# Patient Record
Sex: Male | Born: 1965 | Race: White | Hispanic: No | Smoking: Never smoker
Health system: Southern US, Community
[De-identification: ages and names within clinical notes are randomized; demographics above are authoritative.]

## PROBLEM LIST (undated history)

## (undated) DIAGNOSIS — I10 Essential (primary) hypertension: Secondary | ICD-10-CM

## (undated) DIAGNOSIS — R945 Abnormal results of liver function studies: Secondary | ICD-10-CM

## (undated) DIAGNOSIS — N5089 Other specified disorders of the male genital organs: Secondary | ICD-10-CM

## (undated) DIAGNOSIS — R7989 Other specified abnormal findings of blood chemistry: Secondary | ICD-10-CM

## (undated) DIAGNOSIS — R7303 Prediabetes: Secondary | ICD-10-CM

## (undated) DIAGNOSIS — E119 Type 2 diabetes mellitus without complications: Secondary | ICD-10-CM

## (undated) DIAGNOSIS — C801 Malignant (primary) neoplasm, unspecified: Secondary | ICD-10-CM

## (undated) DIAGNOSIS — Z973 Presence of spectacles and contact lenses: Secondary | ICD-10-CM

## (undated) DIAGNOSIS — E785 Hyperlipidemia, unspecified: Secondary | ICD-10-CM

## (undated) DIAGNOSIS — J302 Other seasonal allergic rhinitis: Secondary | ICD-10-CM

## (undated) HISTORY — DX: Abnormal results of liver function studies: R94.5

## (undated) HISTORY — DX: Malignant (primary) neoplasm, unspecified: C80.1

## (undated) HISTORY — DX: Hyperlipidemia, unspecified: E78.5

## (undated) HISTORY — DX: Other specified abnormal findings of blood chemistry: R79.89

## (undated) HISTORY — DX: Essential (primary) hypertension: I10

## (undated) HISTORY — DX: Prediabetes: R73.03

## (undated) HISTORY — DX: Other seasonal allergic rhinitis: J30.2

## (undated) HISTORY — PX: REFRACTIVE SURGERY: SHX103

---
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
## 1997-08-07 HISTORY — PX: VASECTOMY: SHX75

## 2009-08-07 HISTORY — PX: RETINAL DETACHMENT SURGERY: SHX105

## 2009-08-07 HISTORY — PX: KNEE ARTHROSCOPY: SHX127

## 2010-06-07 HISTORY — PX: KNEE ARTHROSCOPY: SHX127

## 2013-02-28 ENCOUNTER — Other Ambulatory Visit: Payer: Self-pay | Admitting: Family Medicine

## 2013-02-28 MED ORDER — IRBESARTAN 300 MG PO TABS: 300.0000 mg | ORAL_TABLET | Freq: Every day | ORAL | Status: DC

## 2013-02-28 NOTE — Telephone Encounter (Signed)
Rx Refilled  

## 2013-06-30 ENCOUNTER — Other Ambulatory Visit: Payer: Self-pay | Admitting: Family Medicine

## 2013-07-01 ENCOUNTER — Encounter: Payer: Self-pay | Admitting: Family Medicine

## 2013-07-01 NOTE — Telephone Encounter (Signed)
Medication refill for one time only.  Patient needs to be seen.  Letter sent for patient to call and schedule 

## 2013-07-02 ENCOUNTER — Telehealth: Payer: Self-pay | Admitting: Family Medicine

## 2013-07-02 NOTE — Telephone Encounter (Signed)
Pt is calling today because his refill on his Allopurinol was denied and he was told to contact the office Pharmacy is [facility] on [address] Call back number is [phone] If he doesn't answer he said just to leave him a voicemail

## 2013-07-02 NOTE — Telephone Encounter (Signed)
.  Patient aware per vm - ntbs - lov 07/08/2012 and rx was filled for 30 day supply - long enough for him to make appt.

## 2013-07-17 ENCOUNTER — Encounter: Payer: Self-pay | Admitting: Family Medicine

## 2013-07-17 ENCOUNTER — Ambulatory Visit (INDEPENDENT_AMBULATORY_CARE_PROVIDER_SITE_OTHER): Payer: No Typology Code available for payment source | Admitting: Family Medicine

## 2013-07-17 VITALS — BP 140/82 | HR 72 | Temp 98.6°F | Resp 18 | Ht 69.0 in | Wt 232.0 lb

## 2013-07-17 DIAGNOSIS — R7303 Prediabetes: Secondary | ICD-10-CM | POA: Insufficient documentation

## 2013-07-17 DIAGNOSIS — I10 Essential (primary) hypertension: Secondary | ICD-10-CM

## 2013-07-17 NOTE — Progress Notes (Signed)
   Subjective:    Patient ID: Leonard Garrett, male    DOB: 26-Nov-1965, 47 y.o.   MRN: [id number]  HPI  Patient is here today for followup of his hypertension, hyperlipidemia, and prediabetes. Patient monitors his blood pressure home and states it runs 120s to 130s over 70s to 80s. He denies any chest pain shortness of breath or dyspnea on exertion. He does have a history of prediabetes. Is overdue to check his blood sugar as well as hemoglobin A1c. He also has a history of dyslipidemia but he refuses medications to lower his cholesterol. He also declines a flu shot today. Past Medical History  Diagnosis Date  . Hypertension   . Hyperlipidemia   . Prediabetes    Current Outpatient Prescriptions on File Prior to Visit  Medication Sig Dispense Refill  . allopurinol (ZYLOPRIM) 300 MG tablet TAKE 1 TABLET BY MOUTH EVERY DAY FOR GOUT  30 tablet  0  . irbesartan (AVAPRO) 300 MG tablet Take 1 tablet (300 mg total) by mouth at bedtime.  30 tablet  5   No current facility-administered medications on file prior to visit.   No Known Allergies History   Social History  . Marital Status: Widowed    Spouse Name: N/A    Number of Children: N/A  . Years of Education: N/A   Occupational History  . Not on file.   Social History Main Topics  . Smoking status: Never Smoker   . Smokeless tobacco: Not on file  . Alcohol Use: Yes  . Drug Use: Not on file  . Sexual Activity: Not on file   Other Topics Concern  . Not on file   Social History Narrative  . No narrative on file     Review of Systems  All other systems reviewed and are negative.       Objective:   Physical Exam  Vitals reviewed. Neck: No JVD present. No thyromegaly present.  Cardiovascular: Normal rate, regular rhythm and normal heart sounds.  Exam reveals no gallop and no friction rub.   No murmur heard. Pulmonary/Chest: Effort normal and breath sounds normal. No respiratory distress. He has no wheezes. He has no rales.    Abdominal: Soft. Bowel sounds are normal. He exhibits no distension. There is no tenderness. There is no rebound and no guarding.  Musculoskeletal: He exhibits no edema.          Assessment & Plan:  1. HTN (hypertension) Blood pressures not controlled today. Patient is adamant that his blood pressure is better at home. He states he will monitor at home for the next 2 weeks and notify me of the results. I explained to him the increased risk of cardiovascular disease blood pressures are elevated. Patient states he will monitor. His history prediabetes. I asked him to return fasting for hemoglobin A1c is monitor this along with fasting lipid panel. However the patient is very resistant chime medications for cholesterol. He also declines a flu shot today adamantly - COMPLETE METABOLIC PANEL WITH GFR; Future - Lipid panel; Future - Hemoglobin A1c; Future

## 2013-07-18 ENCOUNTER — Other Ambulatory Visit: Payer: No Typology Code available for payment source

## 2013-07-18 DIAGNOSIS — I10 Essential (primary) hypertension: Secondary | ICD-10-CM

## 2013-07-18 LAB — COMPLETE METABOLIC PANEL WITH GFR
ALT: 30 U/L (ref 0–53)
AST: 28 U/L (ref 0–37)
CO2: 29 mEq/L (ref 19–32)
Calcium: 9.7 mg/dL (ref 8.4–10.5)
Chloride: 100 mEq/L (ref 96–112)
GFR, Est African American: >89 mL/min
Sodium: 138 mEq/L (ref 135–145)
Total Bilirubin: 0.9 mg/dL (ref 0.3–1.2)
Total Protein: 7.4 g/dL (ref 6.0–8.3)

## 2013-07-18 LAB — HEMOGLOBIN A1C
Hgb A1c MFr Bld: 5.2 % (ref <5.7)
Mean Plasma Glucose: 103 mg/dL (ref <117)

## 2013-07-18 LAB — LIPID PANEL
HDL: 62 mg/dL (ref >39)
LDL Cholesterol: 63 mg/dL (ref 0–99)
Triglycerides: 145 mg/dL (ref <150)
VLDL: 29 mg/dL (ref 0–40)

## 2013-08-08 ENCOUNTER — Other Ambulatory Visit: Payer: Self-pay | Admitting: Family Medicine

## 2013-08-17 ENCOUNTER — Encounter: Payer: Self-pay | Admitting: Family Medicine

## 2013-08-31 ENCOUNTER — Other Ambulatory Visit: Payer: Self-pay | Admitting: Family Medicine

## 2013-09-01 ENCOUNTER — Other Ambulatory Visit: Payer: Self-pay | Admitting: Family Medicine

## 2014-01-05 ENCOUNTER — Other Ambulatory Visit: Payer: Self-pay | Admitting: Family Medicine

## 2014-02-08 ENCOUNTER — Other Ambulatory Visit: Payer: Self-pay | Admitting: Family Medicine

## 2014-03-03 ENCOUNTER — Other Ambulatory Visit: Payer: Self-pay | Admitting: Family Medicine

## 2014-03-03 NOTE — Telephone Encounter (Signed)
Refill appropriate and filled per protocol. 

## 2014-06-17 ENCOUNTER — Other Ambulatory Visit: Payer: Self-pay | Admitting: Family Medicine

## 2014-06-18 NOTE — Telephone Encounter (Signed)
Medication filled x1 with no refills.   Requires office visit before any further refills can be given.   Letter sent.  

## 2014-07-14 ENCOUNTER — Other Ambulatory Visit: Payer: Self-pay | Admitting: Family Medicine

## 2014-07-15 NOTE — Telephone Encounter (Signed)
Medication filled x1 with no refills.   CPE scheduled for 07/21/2014.

## 2014-07-16 ENCOUNTER — Other Ambulatory Visit: Payer: Self-pay | Admitting: Family Medicine

## 2014-07-16 ENCOUNTER — Other Ambulatory Visit: Payer: No Typology Code available for payment source

## 2014-07-16 DIAGNOSIS — Z0000 Encounter for general adult medical examination without abnormal findings: Secondary | ICD-10-CM

## 2014-07-16 DIAGNOSIS — Z79899 Other long term (current) drug therapy: Secondary | ICD-10-CM

## 2014-07-16 LAB — COMPLETE METABOLIC PANEL WITH GFR
ALBUMIN: 4.3 g/dL (ref 3.5–5.2)
ALK PHOS: 63 U/L (ref 39–117)
ALT: 56 U/L — ABNORMAL HIGH (ref 0–53)
AST: 49 U/L — AB (ref 0–37)
BUN: 11 mg/dL (ref 6–23)
CO2: 27 mEq/L (ref 19–32)
Calcium: 9.9 mg/dL (ref 8.4–10.5)
Chloride: 101 mEq/L (ref 96–112)
Creat: 0.98 mg/dL (ref 0.50–1.35)
GFR, Est African American: >89 mL/min
Glucose, Bld: 117 mg/dL — ABNORMAL HIGH (ref 70–99)
POTASSIUM: 4.3 meq/L (ref 3.5–5.3)
Sodium: 138 mEq/L (ref 135–145)
Total Bilirubin: 0.7 mg/dL (ref 0.2–1.2)
Total Protein: 7 g/dL (ref 6.0–8.3)

## 2014-07-16 LAB — LIPID PANEL
CHOLESTEROL: 158 mg/dL (ref 0–200)
HDL: 53 mg/dL (ref >39)
LDL Cholesterol: 79 mg/dL (ref 0–99)
Total CHOL/HDL Ratio: 3 Ratio
Triglycerides: 132 mg/dL (ref <150)
VLDL: 26 mg/dL (ref 0–40)

## 2014-07-16 LAB — CBC WITH DIFFERENTIAL/PLATELET
Basophils Absolute: 0.1 10*3/uL (ref 0.0–0.1)
Basophils Relative: 1 % (ref 0–1)
EOS PCT: 1 % (ref 0–5)
Eosinophils Absolute: 0.1 10*3/uL (ref 0.0–0.7)
HEMATOCRIT: 37.1 % — AB (ref 39.0–52.0)
HEMOGLOBIN: 12.8 g/dL — AB (ref 13.0–17.0)
LYMPHS PCT: 23 % (ref 12–46)
Lymphs Abs: 1.9 10*3/uL (ref 0.7–4.0)
MCH: 31.8 pg (ref 26.0–34.0)
MCHC: 34.5 g/dL (ref 30.0–36.0)
MCV: 92.1 fL (ref 78.0–100.0)
MONOS PCT: 9 % (ref 3–12)
MPV: 9.2 fL — AB (ref 9.4–12.4)
Monocytes Absolute: 0.7 10*3/uL (ref 0.1–1.0)
Neutro Abs: 5.4 10*3/uL (ref 1.7–7.7)
Neutrophils Relative %: 66 % (ref 43–77)
Platelets: 251 10*3/uL (ref 150–400)
RBC: 4.03 MIL/uL — ABNORMAL LOW (ref 4.22–5.81)
RDW: 13.3 % (ref 11.5–15.5)
WBC: 8.2 10*3/uL (ref 4.0–10.5)

## 2014-07-16 LAB — HEMOGLOBIN A1C
Hgb A1c MFr Bld: 6.2 % — ABNORMAL HIGH (ref <5.7)
Mean Plasma Glucose: 131 mg/dL — ABNORMAL HIGH (ref <117)

## 2014-07-21 ENCOUNTER — Encounter: Payer: Self-pay | Admitting: Family Medicine

## 2014-07-21 ENCOUNTER — Ambulatory Visit (INDEPENDENT_AMBULATORY_CARE_PROVIDER_SITE_OTHER): Payer: No Typology Code available for payment source | Admitting: Family Medicine

## 2014-07-21 VITALS — BP 148/84 | HR 76 | Temp 98.0°F | Resp 18 | Ht 69.5 in | Wt 239.0 lb

## 2014-07-21 DIAGNOSIS — Z0000 Encounter for general adult medical examination without abnormal findings: Secondary | ICD-10-CM

## 2014-07-21 NOTE — Progress Notes (Signed)
Subjective:    Patient ID: Leonard Garrett, male    DOB: May 05, 1966, 48 y.o.   MRN: [id number]  HPI Patient is here for CPE.    He has no major medical concerns. He is due for colonoscopy. He is due for a PSA and digital rectal exam. Patient refuses his flu shot today. He declines a tetanus vaccine.Most recent lab work is listed below: Lab on 07/16/2014  Component Date Value Ref Range Status  . Cholesterol 07/16/2014 158  0 - 200 mg/dL Final   Comment: ATP III Classification:       < 200        mg/dL        Desirable      200 - 239     mg/dL        Borderline High      >= 240        mg/dL        High     . Triglycerides 07/16/2014 132  <150 mg/dL Final  . HDL 07/16/2014 53  >39 mg/dL Final  . Total CHOL/HDL Ratio 07/16/2014 3.0   Final  . VLDL 07/16/2014 26  0 - 40 mg/dL Final  . LDL Cholesterol 07/16/2014 79  0 - 99 mg/dL Final   Comment:   Total Cholesterol/HDL Ratio:CHD Risk                        Coronary Heart Disease Risk Table                                        Men       Women          1/2 Average Risk              3.4        3.3              Average Risk              5.0        4.4           2X Average Risk              9.6        7.1           3X Average Risk             23.4       11.0 Use the calculated Patient Ratio above and the CHD Risk table  to determine the patient's CHD Risk. ATP III Classification (LDL):       < 100        mg/dL         Optimal      100 - 129     mg/dL         Near or Above Optimal      130 - 159     mg/dL         Borderline High      160 - 189     mg/dL         High       > 190        mg/dL         Very High     . WBC 07/16/2014 8.2  4.0 - 10.5 K/uL Final  .  RBC 07/16/2014 4.03* 4.22 - 5.81 MIL/uL Final  . Hemoglobin 07/16/2014 12.8* 13.0 - 17.0 g/dL Final  . HCT 07/16/2014 37.1* 39.0 - 52.0 % Final  . MCV 07/16/2014 92.1  78.0 - 100.0 fL Final  . MCH 07/16/2014 31.8  26.0 - 34.0 pg Final  . MCHC 07/16/2014 34.5  30.0 - 36.0 g/dL  Final  . RDW 07/16/2014 13.3  11.5 - 15.5 % Final  . Platelets 07/16/2014 251  150 - 400 K/uL Final  . MPV 07/16/2014 9.2* 9.4 - 12.4 fL Final  . Neutrophils Relative % 07/16/2014 66  43 - 77 % Final  . Neutro Abs 07/16/2014 5.4  1.7 - 7.7 K/uL Final  . Lymphocytes Relative 07/16/2014 23  12 - 46 % Final  . Lymphs Abs 07/16/2014 1.9  0.7 - 4.0 K/uL Final  . Monocytes Relative 07/16/2014 9  3 - 12 % Final  . Monocytes Absolute 07/16/2014 0.7  0.1 - 1.0 K/uL Final  . Eosinophils Relative 07/16/2014 1  0 - 5 % Final  . Eosinophils Absolute 07/16/2014 0.1  0.0 - 0.7 K/uL Final  . Basophils Relative 07/16/2014 1  0 - 1 % Final  . Basophils Absolute 07/16/2014 0.1  0.0 - 0.1 K/uL Final  . Smear Review 07/16/2014 Criteria for review not met   Final  . Hgb A1c MFr Bld 07/16/2014 6.2* <5.7 % Final   Comment:                                                                        According to the ADA Clinical Practice Recommendations for 2011, when HbA1c is used as a screening test:     >=6.5%   Diagnostic of Diabetes Mellitus            (if abnormal result is confirmed)   5.7-6.4%   Increased risk of developing Diabetes Mellitus   References:Diagnosis and Classification of Diabetes Mellitus,Diabetes LXBW,6203,55(HRCBU 1):S62-S69 and Standards of Medical Care in         Diabetes - 2011,Diabetes LAGT,3646,80 (Suppl 1):S11-S61.     . Mean Plasma Glucose 07/16/2014 131* <117 mg/dL Final  . Sodium 07/16/2014 138  135 - 145 mEq/L Final  . Potassium 07/16/2014 4.3  3.5 - 5.3 mEq/L Final  . Chloride 07/16/2014 101  96 - 112 mEq/L Final  . CO2 07/16/2014 27  19 - 32 mEq/L Final  . Glucose, Bld 07/16/2014 117* 70 - 99 mg/dL Final  . BUN 07/16/2014 11  6 - 23 mg/dL Final  . Creat 07/16/2014 0.98  0.50 - 1.35 mg/dL Final  . Total Bilirubin 07/16/2014 0.7  0.2 - 1.2 mg/dL Final  . Alkaline Phosphatase 07/16/2014 63  39 - 117 U/L Final  . AST 07/16/2014 49* 0 - 37 U/L Final  . ALT 07/16/2014 56* 0 -  53 U/L Final  . Total Protein 07/16/2014 7.0  6.0 - 8.3 g/dL Final  . Albumin 07/16/2014 4.3  3.5 - 5.2 g/dL Final  . Calcium 07/16/2014 9.9  8.4 - 10.5 mg/dL Final  . GFR, Est African American 07/16/2014 >89   Final  . GFR, Est Non African American 07/16/2014 >89   Final   Comment:   The estimated GFR is a  calculation valid for adults (>=2 years old) that uses the CKD-EPI algorithm to adjust for age and sex. It is   not to be used for children, pregnant women, hospitalized patients,    patients on dialysis, or with rapidly changing kidney function. According to the NKDEP, eGFR >89 is normal, 60-89 shows mild impairment, 30-59 shows moderate impairment, 15-29 shows severe impairment and <15 is ESRD.      Past Medical History  Diagnosis Date  . Hypertension   . Hyperlipidemia   . Prediabetes    No past surgical history on file. Current Outpatient Prescriptions on File Prior to Visit  Medication Sig Dispense Refill  . allopurinol (ZYLOPRIM) 300 MG tablet TAKE 1 TABLET BY MOUTH EVERY DAY FOR GOUT 30 tablet 0  . amLODipine (NORVASC) 5 MG tablet TAKE 1 TABLET BY MOUTH EVERY DAY 30 tablet 0  . aspirin 81 MG tablet Take 81 mg by mouth daily.    Marland Kitchen glucosamine-chondroitin 500-400 MG tablet Take 1 tablet by mouth 3 (three) times daily.    . irbesartan (AVAPRO) 300 MG tablet TAKE 1 TABLET BY MOUTH EVERY NIGHT AT BEDTIME 30 tablet 0  . Multiple Vitamins-Minerals (MULTIVITAMIN PO) Take by mouth.    . Omega-3 Fatty Acids (FISH OIL) 1200 MG CPDR Take by mouth.     No current facility-administered medications on file prior to visit.   No Known Allergies History   Social History  . Marital Status: Widowed    Spouse Name: N/A    Number of Children: N/A  . Years of Education: N/A   Occupational History  . Not on file.   Social History Main Topics  . Smoking status: Never Smoker   . Smokeless tobacco: Not on file  . Alcohol Use: Yes  . Drug Use: Not on file  . Sexual Activity: Not  on file   Other Topics Concern  . Not on file   Social History Narrative  . No narrative on file   No family history on file.    Review of Systems  All other systems reviewed and are negative.      Objective:   Physical Exam  Constitutional: He is oriented to person, place, and time. He appears well-developed and well-nourished. No distress.  HENT:  Head: Normocephalic and atraumatic.  Right Ear: External ear normal.  Left Ear: External ear normal.  Nose: Nose normal.  Mouth/Throat: Oropharynx is clear and moist. No oropharyngeal exudate.  Eyes: Conjunctivae and EOM are normal. Pupils are equal, round, and reactive to light. Right eye exhibits no discharge. Left eye exhibits no discharge. No scleral icterus.  Neck: Normal range of motion. Neck supple. No JVD present. No tracheal deviation present. No thyromegaly present.  Cardiovascular: Normal rate, regular rhythm, normal heart sounds and intact distal pulses.  Exam reveals no gallop and no friction rub.   No murmur heard. Pulmonary/Chest: Effort normal and breath sounds normal. No stridor. No respiratory distress. He has no wheezes. He has no rales. He exhibits no tenderness.  Abdominal: Soft. Bowel sounds are normal. He exhibits no distension and no mass. There is no tenderness. There is no rebound and no guarding.  Genitourinary: Rectum normal and prostate normal.  Musculoskeletal: Normal range of motion. He exhibits no edema or tenderness.  Lymphadenopathy:    He has no cervical adenopathy.  Neurological: He is alert and oriented to person, place, and time. He has normal reflexes. He displays normal reflexes. No cranial nerve deficit. He exhibits normal muscle tone.  Coordination normal.  Skin: Skin is warm. No rash noted. He is not diaphoretic. No erythema. No pallor.  Psychiatric: He has a normal mood and affect. His behavior is normal. Judgment and thought content normal.  Vitals reviewed.         Assessment &  Plan:  Routine general medical examination at a health care facility physical exam is normal. Lab work is significant for prediabetes. I recommended a low carbohydrate diet and increasing aerobic exercise to 30 minutes a day 5 days a week. I would like the patient to return in 3 months for a repeat CMP as well as a hemoglobin A1c. If patient's liver function tests are still elevated at that time, I will proceed with a liver ultrasound and also viral hepatitis panel. Patient refuses a flu shot. I will schedule him for a colonoscopy. Patient is comfortable with monitoring his liver function tests for 3 months. Also consider discontinuing allopurinol if liver tests remain elevated.

## 2014-07-22 LAB — PSA: PSA: 0.68 ng/mL (ref <=4.00)

## 2014-07-23 ENCOUNTER — Encounter: Payer: Self-pay | Admitting: Family Medicine

## 2014-08-09 ENCOUNTER — Other Ambulatory Visit: Payer: Self-pay | Admitting: Family Medicine

## 2014-08-31 ENCOUNTER — Encounter: Payer: Self-pay | Admitting: Gastroenterology

## 2014-09-07 ENCOUNTER — Other Ambulatory Visit: Payer: Self-pay | Admitting: Family Medicine

## 2014-09-08 NOTE — Telephone Encounter (Signed)
Medication refilled per protocol. 

## 2014-10-16 ENCOUNTER — Ambulatory Visit (AMBULATORY_SURGERY_CENTER): Payer: Self-pay | Admitting: *Deleted

## 2014-10-16 VITALS — Ht 69.0 in | Wt 243.8 lb

## 2014-10-16 DIAGNOSIS — Z1211 Encounter for screening for malignant neoplasm of colon: Secondary | ICD-10-CM

## 2014-10-16 MED ORDER — MOVIPREP 100 G PO SOLR: 1.0000 | Freq: Once | ORAL | Status: DC

## 2014-10-16 NOTE — Progress Notes (Signed)
Denies allergies to eggs or soy products. Denies complications with sedation or anesthesia. Denies O2 use. Denies use of diet or weight loss medications.  Emmi instructions given for colonoscopy.  

## 2014-10-29 ENCOUNTER — Encounter: Payer: Self-pay | Admitting: Gastroenterology

## 2014-10-29 ENCOUNTER — Ambulatory Visit (AMBULATORY_SURGERY_CENTER): Payer: PRIVATE HEALTH INSURANCE | Admitting: Gastroenterology

## 2014-10-29 VITALS — BP 110/68 | HR 74 | Temp 98.3°F | Resp 32 | Ht 69.0 in | Wt 243.0 lb

## 2014-10-29 DIAGNOSIS — D120 Benign neoplasm of cecum: Secondary | ICD-10-CM

## 2014-10-29 DIAGNOSIS — Z1211 Encounter for screening for malignant neoplasm of colon: Secondary | ICD-10-CM

## 2014-10-29 DIAGNOSIS — D123 Benign neoplasm of transverse colon: Secondary | ICD-10-CM

## 2014-10-29 HISTORY — PX: COLONOSCOPY: SHX174

## 2014-10-29 MED ORDER — SODIUM CHLORIDE 0.9 % IV SOLN: 500.0000 mL | INTRAVENOUS | Status: DC

## 2014-10-29 NOTE — Progress Notes (Signed)
To recobvery, report to [employer], RN, VSS.

## 2014-10-29 NOTE — Progress Notes (Signed)
Called to room to assist during endoscopic procedure.  Patient ID and intended procedure confirmed with present staff. Received instructions for my participation in the procedure from the performing physician.  

## 2014-10-29 NOTE — Patient Instructions (Signed)
Discharge instructions given. Handouts on polyps and diverticulosis. Resume previous medications. YOU HAD AN ENDOSCOPIC PROCEDURE TODAY AT THE [facility]:   Refer to the procedure report that was given to you for any specific questions about what was found during the examination.  If the procedure report does not answer your questions, please call your gastroenterologist to clarify.  If you requested that your care partner not be given the details of your procedure findings, then the procedure report has been included in a sealed envelope for you to review at your convenience later.  YOU SHOULD EXPECT: Some feelings of bloating in the abdomen. Passage of more gas than usual.  Walking can help get rid of the air that was put into your GI tract during the procedure and reduce the bloating. If you had a lower endoscopy (such as a colonoscopy or flexible sigmoidoscopy) you may notice spotting of blood in your stool or on the toilet paper. If you underwent a bowel prep for your procedure, you may not have a normal bowel movement for a few days.  Please Note:  You might notice some irritation and congestion in your nose or some drainage.  This is from the oxygen used during your procedure.  There is no need for concern and it should clear up in a day or so.  SYMPTOMS TO REPORT IMMEDIATELY:   Following lower endoscopy (colonoscopy or flexible sigmoidoscopy):  Excessive amounts of blood in the stool  Significant tenderness or worsening of abdominal pains  Swelling of the abdomen that is new, acute  Fever of 100F or higher   For urgent or emergent issues, a gastroenterologist can be reached at any hour by calling [phone].   DIET: Your first meal following the procedure should be a small meal and then it is ok to progress to your normal diet. Heavy or fried foods are harder to digest and may make you feel nauseous or bloated.  Likewise, meals heavy in dairy and vegetables can  increase bloating.  Drink plenty of fluids but you should avoid alcoholic beverages for 24 hours.  ACTIVITY:  You should plan to take it easy for the rest of today and you should NOT DRIVE or use heavy machinery until tomorrow (because of the sedation medicines used during the test).    FOLLOW UP: Our staff will call the number listed on your records the next business day following your procedure to check on you and address any questions or concerns that you may have regarding the information given to you following your procedure. If we do not reach you, we will leave a message.  However, if you are feeling well and you are not experiencing any problems, there is no need to return our call.  We will assume that you have returned to your regular daily activities without incident.  If any biopsies were taken you will be contacted by phone or by letter within the next 1-3 weeks.  Please call us at [phone] if you have not heard about the biopsies in 3 weeks.    SIGNATURES/CONFIDENTIALITY: You and/or your care partner have signed paperwork which will be entered into your electronic medical record.  These signatures attest to the fact that that the information above on your After Visit Summary has been reviewed and is understood.  Full responsibility of the confidentiality of this discharge information lies with you and/or your care-partner. 

## 2014-10-29 NOTE — Op Note (Signed)
[place]  [address] [place], [address]   COLONOSCOPY PROCEDURE REPORT  PATIENT: Leonard Garrett, Leonard Garrett  MR#: [id number] BIRTHDATE: 21-Apr-1966 , 48  yrs. old GENDER: male ENDOSCOPIST: Ladene Artist, MD, [facility] REFERRED VU:DTHYHO [name], M.D. PROCEDURE DATE:  10/29/2014 PROCEDURE:   Colonoscopy, screening and Colonoscopy with snare polypectomy First Screening Colonoscopy - Avg.  risk and is 50 yrs.  old or older Yes.  Prior Negative Screening - Now for repeat screening. N/A  History of Adenoma - Now for follow-up colonoscopy & has been > or = to 3 yrs.  N/A ASA CLASS:   Class II INDICATIONS:Screening for colonic neoplasia and Colorectal Neoplasm Risk Assessment for this procedure is average risk. MEDICATIONS: Monitored anesthesia care and Propofol 500 mg IV DESCRIPTION OF PROCEDURE:   After the risks benefits and alternatives of the procedure were thoroughly explained, informed consent was obtained.  The digital rectal exam revealed no abnormalities of the rectum.   The LB [id number]  endoscope was introduced through the anus and advanced to the cecum, which was identified by both the appendix and ileocecal valve. No adverse events experienced.   The quality of the prep was adequate (MoviPrep was used)  The instrument was then slowly withdrawn as the colon was fully examined.  COLON FINDINGS: A sessile polyp measuring 10 mm in size was found at the cecum.  A polypectomy was performed using snare cautery.  The resection was complete, the polyp tissue was completely retrieved and sent to histology.   Three sessile polyps measuring 7-8 mm in size were found in the transverse colon.  Polypectomies were performed with a cold snare.  The resection was complete, the polyp tissue was completely retrieved and sent to histology.  There was moderate diverticulosis noted in the sigmoid colon.  The examination was otherwise normal.  Retroflexed views revealed  no abnormalities. The time to cecum = 1.7 Withdrawal time = 16.6   The scope was withdrawn and the procedure completed. COMPLICATIONS: There were no immediate complications.  ENDOSCOPIC IMPRESSION: 1.   Sessile polyp at the cecum; polypectomy performed using snare cautery 2.   Three sessile polyps in the transverse colon; polypectomies performed with a cold snare 3.   Moderate diverticulosis in the sigmoid colon  RECOMMENDATIONS: 1.  Await pathology results 2.  High fiber diet with liberal fluid intake. 3.  Repeat colonoscopy in 3 years if the largest polyp or the 3 others are adenomatous; 5 years if 1-2 smaller polyps are adenomatous; otherwise 10 years  eSigned:  Ladene Artist, MD, [facility] 10/29/2014 11:28 AM

## 2014-11-02 ENCOUNTER — Telehealth: Payer: Self-pay

## 2014-11-02 DIAGNOSIS — H2513 Age-related nuclear cataract, bilateral: Secondary | ICD-10-CM | POA: Insufficient documentation

## 2014-11-02 DIAGNOSIS — H401290 Low-tension glaucoma, unspecified eye, stage unspecified: Secondary | ICD-10-CM | POA: Insufficient documentation

## 2014-11-02 DIAGNOSIS — H31099 Other chorioretinal scars, unspecified eye: Secondary | ICD-10-CM | POA: Insufficient documentation

## 2014-11-02 DIAGNOSIS — H35439 Paving stone degeneration of retina, unspecified eye: Secondary | ICD-10-CM | POA: Insufficient documentation

## 2014-11-02 DIAGNOSIS — H43819 Vitreous degeneration, unspecified eye: Secondary | ICD-10-CM | POA: Insufficient documentation

## 2014-11-02 NOTE — Telephone Encounter (Signed)
Left message on answering machine. 

## 2014-11-06 ENCOUNTER — Encounter: Payer: Self-pay | Admitting: Gastroenterology

## 2014-11-09 ENCOUNTER — Encounter: Payer: Self-pay | Admitting: Family Medicine

## 2014-11-09 ENCOUNTER — Other Ambulatory Visit: Payer: Self-pay | Admitting: Family Medicine

## 2014-11-09 DIAGNOSIS — R7989 Other specified abnormal findings of blood chemistry: Secondary | ICD-10-CM

## 2014-11-09 DIAGNOSIS — R739 Hyperglycemia, unspecified: Secondary | ICD-10-CM

## 2014-11-09 DIAGNOSIS — R945 Abnormal results of liver function studies: Principal | ICD-10-CM

## 2014-12-11 ENCOUNTER — Other Ambulatory Visit: Payer: No Typology Code available for payment source

## 2014-12-11 DIAGNOSIS — R945 Abnormal results of liver function studies: Principal | ICD-10-CM

## 2014-12-11 DIAGNOSIS — R7989 Other specified abnormal findings of blood chemistry: Secondary | ICD-10-CM

## 2014-12-11 DIAGNOSIS — R739 Hyperglycemia, unspecified: Secondary | ICD-10-CM

## 2014-12-11 LAB — COMPREHENSIVE METABOLIC PANEL
ALT: 53 U/L (ref 0–53)
AST: 59 U/L — ABNORMAL HIGH (ref 0–37)
Albumin: 4 g/dL (ref 3.5–5.2)
Alkaline Phosphatase: 71 U/L (ref 39–117)
BUN: 10 mg/dL (ref 6–23)
CO2: 25 mEq/L (ref 19–32)
Calcium: 9.1 mg/dL (ref 8.4–10.5)
Chloride: 101 mEq/L (ref 96–112)
Creat: 1.06 mg/dL (ref 0.50–1.35)
Glucose, Bld: 153 mg/dL — ABNORMAL HIGH (ref 70–99)
POTASSIUM: 4.5 meq/L (ref 3.5–5.3)
SODIUM: 138 meq/L (ref 135–145)
Total Bilirubin: 0.6 mg/dL (ref 0.2–1.2)
Total Protein: 6.7 g/dL (ref 6.0–8.3)

## 2014-12-11 LAB — HEMOGLOBIN A1C
Hgb A1c MFr Bld: 6.8 % — ABNORMAL HIGH (ref <5.7)
Mean Plasma Glucose: 148 mg/dL — ABNORMAL HIGH (ref <117)

## 2014-12-14 ENCOUNTER — Encounter: Payer: Self-pay | Admitting: Family Medicine

## 2014-12-16 ENCOUNTER — Encounter: Payer: Self-pay | Admitting: Family Medicine

## 2014-12-17 ENCOUNTER — Ambulatory Visit: Payer: No Typology Code available for payment source | Admitting: Family Medicine

## 2014-12-22 ENCOUNTER — Encounter: Payer: Self-pay | Admitting: Family Medicine

## 2014-12-22 ENCOUNTER — Ambulatory Visit (INDEPENDENT_AMBULATORY_CARE_PROVIDER_SITE_OTHER): Payer: No Typology Code available for payment source | Admitting: Family Medicine

## 2014-12-22 VITALS — BP 136/84 | HR 80 | Temp 97.9°F | Resp 18 | Ht 69.5 in | Wt 247.0 lb

## 2014-12-22 DIAGNOSIS — Z23 Encounter for immunization: Secondary | ICD-10-CM | POA: Diagnosis not present

## 2014-12-22 DIAGNOSIS — E119 Type 2 diabetes mellitus without complications: Secondary | ICD-10-CM | POA: Diagnosis not present

## 2014-12-22 MED ORDER — METFORMIN HCL 500 MG PO TABS: 500.0000 mg | ORAL_TABLET | Freq: Two times a day (BID) | ORAL | Status: DC

## 2014-12-22 NOTE — Addendum Note (Signed)
Addended by: Shary Decamp B on: 12/22/2014 11:55 AM   Modules accepted: Orders

## 2014-12-22 NOTE — Progress Notes (Signed)
Subjective:    Patient ID: Leonard Garrett, male    DOB: January 26, 1966, 49 y.o.   MRN: [id number]  HPI  Most recent labs are listed below: Appointment on 12/11/2014  Component Date Value Ref Range Status  . Hgb A1c MFr Bld 12/11/2014 6.8* <5.7 % Final   Comment:                                                                        According to the ADA Clinical Practice Recommendations for 2011, when HbA1c is used as a screening test:     >=6.5%   Diagnostic of Diabetes Mellitus            (if abnormal result is confirmed)   5.7-6.4%   Increased risk of developing Diabetes Mellitus   References:Diagnosis and Classification of Diabetes Mellitus,Diabetes NOBS,9628,36(OQHUT 1):S62-S69 and Standards of Medical Care in         Diabetes - 2011,Diabetes MLYY,5035,46 (Suppl 1):S11-S61.     . Mean Plasma Glucose 12/11/2014 148* <117 mg/dL Final  . Sodium 12/11/2014 138  135 - 145 mEq/L Final  . Potassium 12/11/2014 4.5  3.5 - 5.3 mEq/L Final  . Chloride 12/11/2014 101  96 - 112 mEq/L Final  . CO2 12/11/2014 25  19 - 32 mEq/L Final  . Glucose, Bld 12/11/2014 153* 70 - 99 mg/dL Final  . BUN 12/11/2014 10  6 - 23 mg/dL Final  . Creat 12/11/2014 1.06  0.50 - 1.35 mg/dL Final  . Total Bilirubin 12/11/2014 0.6  0.2 - 1.2 mg/dL Final  . Alkaline Phosphatase 12/11/2014 71  39 - 117 U/L Final  . AST 12/11/2014 59* 0 - 37 U/L Final  . ALT 12/11/2014 53  0 - 53 U/L Final  . Total Protein 12/11/2014 6.7  6.0 - 8.3 g/dL Final  . Albumin 12/11/2014 4.0  3.5 - 5.2 g/dL Final  . Calcium 12/11/2014 9.1  8.4 - 10.5 mg/dL Final   patient has tried diet exercise and weight loss however over the last few years, his hemoglobin A1c has steadily risen. He would like to start medication to reduce his blood sugar. Past Medical History  Diagnosis Date  . Hypertension   . Hyperlipidemia   . Prediabetes   . Seasonal allergies    Past Surgical History  Procedure Laterality Date  . Tonsillectomy  1970  .  Closed reduction forearm fracture Left 1978  . Refractive surgery  1998, 2002  . Retinal detachment surgery  2011  . Knee arthroscopy Right 06/2010  . Vasectomy  1999   Current Outpatient Prescriptions on File Prior to Visit  Medication Sig Dispense Refill  . allopurinol (ZYLOPRIM) 300 MG tablet TAKE 1 TABLET BY MOUTH EVERY DAY FOR GOUT 30 tablet 11  . amLODipine (NORVASC) 5 MG tablet TAKE 1 TABLET BY MOUTH EVERY DAY 30 tablet 5  . aspirin 81 MG tablet Take 81 mg by mouth daily.    Marland Kitchen glucosamine-chondroitin 500-400 MG tablet Take 1 tablet by mouth daily.     . irbesartan (AVAPRO) 300 MG tablet TAKE 1 TABLET BY MOUTH EVERY NIGHT AT BEDTIME 30 tablet 11  . Multiple Vitamins-Minerals (MULTIVITAMIN PO) Take by mouth.    . Omega-3 Fatty Acids (FISH OIL)  1200 MG CPDR Take by mouth.    . Probiotic Product (PROBIOTIC DAILY PO) Take by mouth.     No current facility-administered medications on file prior to visit.   No Known Allergies History   Social History  . Marital Status: Widowed    Spouse Name: N/A  . Number of Children: N/A  . Years of Education: N/A   Occupational History  . Not on file.   Social History Main Topics  . Smoking status: Never Smoker   . Smokeless tobacco: Never Used  . Alcohol Use: 4.2 oz/week    7 Standard drinks or equivalent per week  . Drug Use: No  . Sexual Activity: Not on file   Other Topics Concern  . Not on file   Social History Narrative     Review of Systems  All other systems reviewed and are negative.      Objective:   Physical Exam  Constitutional: He appears well-developed and well-nourished.  Neck: Neck supple.  Cardiovascular: Normal rate, regular rhythm and normal heart sounds.   No murmur heard. Pulmonary/Chest: Effort normal and breath sounds normal. No respiratory distress. He has no wheezes. He has no rales.  Abdominal: Soft. Bowel sounds are normal. He exhibits no distension. There is no tenderness. There is no rebound  and no guarding.  Musculoskeletal: He exhibits no edema.  Lymphadenopathy:    He has no cervical adenopathy.  Vitals reviewed.         Assessment & Plan:  Diabetes mellitus type II, non insulin dependent  begin metformin 500 mg by mouth twice a day. Recheck a CMP, fasting lipid panel, and hemoglobin A1c along with a urine microalbumin in 3 months.

## 2015-02-16 ENCOUNTER — Other Ambulatory Visit: Payer: Self-pay | Admitting: Family Medicine

## 2015-02-16 MED ORDER — AMLODIPINE BESYLATE 5 MG PO TABS: 5.0000 mg | ORAL_TABLET | Freq: Every day | ORAL | Status: DC

## 2015-02-16 NOTE — Telephone Encounter (Signed)
Medication refilled per protocol. 

## 2015-07-02 ENCOUNTER — Other Ambulatory Visit: Payer: Self-pay | Admitting: Family Medicine

## 2015-07-26 ENCOUNTER — Other Ambulatory Visit: Payer: Self-pay | Admitting: Family Medicine

## 2015-07-27 ENCOUNTER — Encounter: Payer: Self-pay | Admitting: Family Medicine

## 2015-07-27 NOTE — Telephone Encounter (Signed)
Medication refill for one time only.  Patient needs to be seen.  Letter sent for patient to call and schedule 

## 2015-08-04 ENCOUNTER — Other Ambulatory Visit: Payer: No Typology Code available for payment source

## 2015-08-04 ENCOUNTER — Other Ambulatory Visit: Payer: Self-pay | Admitting: Family Medicine

## 2015-08-04 DIAGNOSIS — E785 Hyperlipidemia, unspecified: Secondary | ICD-10-CM

## 2015-08-04 DIAGNOSIS — E119 Type 2 diabetes mellitus without complications: Secondary | ICD-10-CM

## 2015-08-04 DIAGNOSIS — Z79899 Other long term (current) drug therapy: Secondary | ICD-10-CM

## 2015-08-04 DIAGNOSIS — I10 Essential (primary) hypertension: Secondary | ICD-10-CM

## 2015-08-04 LAB — COMPREHENSIVE METABOLIC PANEL
ALK PHOS: 86 U/L (ref 40–115)
ALT: 89 U/L — AB (ref 9–46)
AST: 74 U/L — AB (ref 10–35)
Albumin: 4.3 g/dL (ref 3.6–5.1)
BILIRUBIN TOTAL: 0.4 mg/dL (ref 0.2–1.2)
BUN: 10 mg/dL (ref 7–25)
CO2: 25 mmol/L (ref 20–31)
CREATININE: 0.89 mg/dL (ref 0.70–1.33)
Calcium: 9.5 mg/dL (ref 8.6–10.3)
Chloride: 99 mmol/L (ref 98–110)
GLUCOSE: 128 mg/dL — AB (ref 70–99)
POTASSIUM: 4.9 mmol/L (ref 3.5–5.3)
SODIUM: 140 mmol/L (ref 135–146)
TOTAL PROTEIN: 7.2 g/dL (ref 6.1–8.1)

## 2015-08-04 LAB — CBC WITH DIFFERENTIAL/PLATELET
BASOS ABS: 0.1 10*3/uL (ref 0.0–0.1)
BASOS PCT: 1 % (ref 0–1)
Eosinophils Absolute: 0.1 10*3/uL (ref 0.0–0.7)
Eosinophils Relative: 2 % (ref 0–5)
HCT: 44.5 % (ref 39.0–52.0)
Hemoglobin: 15.8 g/dL (ref 13.0–17.0)
Lymphocytes Relative: 26 % (ref 12–46)
Lymphs Abs: 1.9 10*3/uL (ref 0.7–4.0)
MCH: 33.8 pg (ref 26.0–34.0)
MCHC: 35.5 g/dL (ref 30.0–36.0)
MCV: 95.1 fL (ref 78.0–100.0)
MONO ABS: 0.6 10*3/uL (ref 0.1–1.0)
MPV: 9.7 fL (ref 8.6–12.4)
Monocytes Relative: 9 % (ref 3–12)
NEUTROS ABS: 4.5 10*3/uL (ref 1.7–7.7)
Neutrophils Relative %: 62 % (ref 43–77)
PLATELETS: 247 10*3/uL (ref 150–400)
RBC: 4.68 MIL/uL (ref 4.22–5.81)
RDW: 13.5 % (ref 11.5–15.5)
WBC: 7.2 10*3/uL (ref 4.0–10.5)

## 2015-08-04 LAB — LIPID PANEL
Cholesterol: 170 mg/dL (ref 125–200)
HDL: 63 mg/dL (ref >=40)
LDL CALC: 87 mg/dL (ref <130)
TRIGLYCERIDES: 102 mg/dL (ref <150)
Total CHOL/HDL Ratio: 2.7 Ratio (ref <=5.0)
VLDL: 20 mg/dL (ref <30)

## 2015-08-04 LAB — HEMOGLOBIN A1C
HEMOGLOBIN A1C: 6.3 % — AB (ref <5.7)
Mean Plasma Glucose: 134 mg/dL — ABNORMAL HIGH (ref <117)

## 2015-08-08 DIAGNOSIS — Z8719 Personal history of other diseases of the digestive system: Secondary | ICD-10-CM

## 2015-08-08 HISTORY — DX: Personal history of other diseases of the digestive system: Z87.19

## 2015-08-16 ENCOUNTER — Ambulatory Visit: Payer: No Typology Code available for payment source | Admitting: Family Medicine

## 2015-08-17 ENCOUNTER — Ambulatory Visit (INDEPENDENT_AMBULATORY_CARE_PROVIDER_SITE_OTHER): Payer: No Typology Code available for payment source | Admitting: Family Medicine

## 2015-08-17 ENCOUNTER — Encounter: Payer: Self-pay | Admitting: Family Medicine

## 2015-08-17 VITALS — BP 158/90 | HR 78 | Temp 98.4°F | Resp 16 | Ht 69.0 in | Wt 246.0 lb

## 2015-08-17 DIAGNOSIS — I10 Essential (primary) hypertension: Secondary | ICD-10-CM | POA: Diagnosis not present

## 2015-08-17 DIAGNOSIS — E119 Type 2 diabetes mellitus without complications: Secondary | ICD-10-CM

## 2015-08-17 DIAGNOSIS — Z23 Encounter for immunization: Secondary | ICD-10-CM | POA: Diagnosis not present

## 2015-08-17 NOTE — Addendum Note (Signed)
Addended by: Shary Decamp B on: 08/17/2015 04:35 PM   Modules accepted: Orders

## 2015-08-17 NOTE — Progress Notes (Signed)
Subjective:    Patient ID: Leonard Garrett, male    DOB: [birth year], 50 y.o.   MRN: [id number]  HPI   12/2014 Patient has tried diet exercise and weight loss however over the last few years, his hemoglobin A1c has steadily risen. He would like to start medication to reduce his blood sugar.  At that time, my plan was: begin metformin 500 mg by mouth twice a day. Recheck a CMP, fasting lipid panel, and hemoglobin A1c along with a urine microalbumin in 3 months.  08/17/15 Most recent labs are below: Lab on 08/04/2015  Component Date Value Ref Range Status  . WBC 08/04/2015 7.2  4.0 - 10.5 K/uL Final  . RBC 08/04/2015 4.68  4.22 - 5.81 MIL/uL Final  . Hemoglobin 08/04/2015 15.8  13.0 - 17.0 g/dL Final  . HCT 08/04/2015 44.5  39.0 - 52.0 % Final  . MCV 08/04/2015 95.1  78.0 - 100.0 fL Final  . MCH 08/04/2015 33.8  26.0 - 34.0 pg Final  . MCHC 08/04/2015 35.5  30.0 - 36.0 g/dL Final  . RDW 08/04/2015 13.5  11.5 - 15.5 % Final  . Platelets 08/04/2015 247  150 - 400 K/uL Final  . MPV 08/04/2015 9.7  8.6 - 12.4 fL Final  . Neutrophils Relative % 08/04/2015 62  43 - 77 % Final  . Neutro Abs 08/04/2015 4.5  1.7 - 7.7 K/uL Final  . Lymphocytes Relative 08/04/2015 26  12 - 46 % Final  . Lymphs Abs 08/04/2015 1.9  0.7 - 4.0 K/uL Final  . Monocytes Relative 08/04/2015 9  3 - 12 % Final  . Monocytes Absolute 08/04/2015 0.6  0.1 - 1.0 K/uL Final  . Eosinophils Relative 08/04/2015 2  0 - 5 % Final  . Eosinophils Absolute 08/04/2015 0.1  0.0 - 0.7 K/uL Final  . Basophils Relative 08/04/2015 1  0 - 1 % Final  . Basophils Absolute 08/04/2015 0.1  0.0 - 0.1 K/uL Final  . Smear Review 08/04/2015 Criteria for review not met   Final  . Sodium 08/04/2015 140  135 - 146 mmol/L Final  . Potassium 08/04/2015 4.9  3.5 - 5.3 mmol/L Final  . Chloride 08/04/2015 99  98 - 110 mmol/L Final  . CO2 08/04/2015 25  20 - 31 mmol/L Final  . Glucose, Bld 08/04/2015 128* 70 - 99 mg/dL Final  . BUN 08/04/2015 10  7 - 25  mg/dL Final  . Creat 08/04/2015 0.89  0.70 - 1.33 mg/dL Final  . Total Bilirubin 08/04/2015 0.4  0.2 - 1.2 mg/dL Final  . Alkaline Phosphatase 08/04/2015 86  40 - 115 U/L Final  . AST 08/04/2015 74* 10 - 35 U/L Final  . ALT 08/04/2015 89* 9 - 46 U/L Final  . Total Protein 08/04/2015 7.2  6.1 - 8.1 g/dL Final  . Albumin 08/04/2015 4.3  3.6 - 5.1 g/dL Final  . Calcium 08/04/2015 9.5  8.6 - 10.3 mg/dL Final  . Hgb A1c MFr Bld 08/04/2015 6.3* <5.7 % Final   Comment:                                                                        According to the ADA Clinical Practice Recommendations for  2011, when HbA1c is used as a screening test:     >=6.5%   Diagnostic of Diabetes Mellitus            (if abnormal result is confirmed)   5.7-6.4%   Increased risk of developing Diabetes Mellitus   References:Diagnosis and Classification of Diabetes Mellitus,Diabetes KDTO,6712,45(YKDXI 1):S62-S69 and Standards of Medical Care in         Diabetes - 2011,Diabetes PJAS,5053,97 (Suppl 1):S11-S61.     . Mean Plasma Glucose 08/04/2015 134* <117 mg/dL Final  . Cholesterol 08/04/2015 170  125 - 200 mg/dL Final  . Triglycerides 08/04/2015 102  <150 mg/dL Final  . HDL 08/04/2015 63  >=40 mg/dL Final  . Total CHOL/HDL Ratio 08/04/2015 2.7  <=5.0 Ratio Final  . VLDL 08/04/2015 20  <30 mg/dL Final  . LDL Cholesterol 08/04/2015 87  <130 mg/dL Final   Comment:   Total Cholesterol/HDL Ratio:CHD Risk                        Coronary Heart Disease Risk Table                                        Men       Women          1/2 Average Risk              3.4        3.3              Average Risk              5.0        4.4           2X Average Risk              9.6        7.1           3X Average Risk             23.4       11.0 Use the calculated Patient Ratio above and the CHD Risk table  to determine the patient's CHD Risk.    The patient's cholesterol is excellent. His hemoglobin A1c has fallen from 6.8-6.5.   His colonoscopy is up-to-date. However he is due for digital rectal exam as well as a PSA , however, he would like [place] to check this at his next visit in 6 months. His blood pressure is significantly elevated today.  However he checks his blood pressure at home, and his blood pressures typically between 120 and 130/70-80. He has significant white coat syndrome. He states that he will start checking it regularly and give me an update in a few weeks. He is due for hepatitis C screening as well as HIV screening however he regularly donates blood at the [facility] and they screen him there and he is always been negative. His eye exam is up-to-date. He has his eyes examined by his ophthalmologist every 6 months after he had a retinal detachment. The remainder of his preventative care is up-to-date. His review of systems is otherwise negative Past Medical History  Diagnosis Date  . Hypertension   . Hyperlipidemia   . Prediabetes   . Seasonal allergies    Past Surgical History  Procedure Laterality Date  . Tonsillectomy  1970  . Closed reduction forearm  fracture Left 1978  . Refractive surgery  1998, 2002  . Retinal detachment surgery  2011  . Knee arthroscopy Right 06/2010  . Vasectomy  1999   Current Outpatient Prescriptions on File Prior to Visit  Medication Sig Dispense Refill  . allopurinol (ZYLOPRIM) 300 MG tablet TAKE 1 TABLET BY MOUTH EVERY DAY FOR GOUT 30 tablet 3  . amLODipine (NORVASC) 5 MG tablet TAKE 1 TABLET(5 MG) BY MOUTH DAILY 90 tablet 0  . aspirin 81 MG tablet Take 81 mg by mouth daily.    Marland Kitchen glucosamine-chondroitin 500-400 MG tablet Take 1 tablet by mouth daily.     . irbesartan (AVAPRO) 300 MG tablet TAKE 1 TABLET BY MOUTH EVERY NIGHT AT BEDTIME 30 tablet 3  . metFORMIN (GLUCOPHAGE) 500 MG tablet Take 1 tablet (500 mg total) by mouth 2 (two) times daily with a meal. 180 tablet 3  . Multiple Vitamins-Minerals (MULTIVITAMIN PO) Take by mouth.    . Omega-3 Fatty Acids (FISH OIL)  1200 MG CPDR Take by mouth.    . Probiotic Product (PROBIOTIC DAILY PO) Take by mouth.     No current facility-administered medications on file prior to visit.   No Known Allergies Social History   Social History  . Marital Status: Widowed    Spouse Name: N/A  . Number of Children: N/A  . Years of Education: N/A   Occupational History  . Not on file.   Social History Main Topics  . Smoking status: Never Smoker   . Smokeless tobacco: Never Used  . Alcohol Use: 4.2 oz/week    7 Standard drinks or equivalent per week  . Drug Use: No  . Sexual Activity: Not on file   Other Topics Concern  . Not on file   Social History Narrative     Review of Systems  All other systems reviewed and are negative.      Objective:   Physical Exam  Constitutional: He appears well-developed and well-nourished.  Neck: Neck supple.  Cardiovascular: Normal rate, regular rhythm and normal heart sounds.   No murmur heard. Pulmonary/Chest: Effort normal and breath sounds normal. No respiratory distress. He has no wheezes. He has no rales.  Abdominal: Soft. Bowel sounds are normal. He exhibits no distension. There is no tenderness. There is no rebound and no guarding.  Musculoskeletal: He exhibits no edema.  Lymphadenopathy:    He has no cervical adenopathy.  Vitals reviewed.         Assessment & Plan:  Diabetes mellitus type II, non insulin dependent (HCC)  Benign essential HTN  Diabetes and cholesterol are well controlled. Blood pressure significantly elevated today however this seems to be due to whitecoat syndrome. The patient will check his blood pressure frequently at home and notify me of the values to my chart. If greater than 140/90, I would add hydrochlorothiazide. He is due for hepatitis c screening as well as HIV screening however he declines this due to the fact he is screened at the [facility]. Colonoscopy is up-to-date. We will defer PSA and digital rectal exam until next  visit. He refuses a flu shot. He received Pneumovax today [name] is a diabetic.

## 2015-08-23 ENCOUNTER — Encounter: Payer: Self-pay | Admitting: Family Medicine

## 2015-10-19 ENCOUNTER — Other Ambulatory Visit: Payer: Self-pay | Admitting: Family Medicine

## 2015-11-08 ENCOUNTER — Other Ambulatory Visit: Payer: Self-pay | Admitting: Family Medicine

## 2015-11-08 NOTE — Telephone Encounter (Signed)
Refill appropriate and filled per protocol. 

## 2015-11-24 ENCOUNTER — Other Ambulatory Visit: Payer: Self-pay | Admitting: Family Medicine

## 2015-12-02 ENCOUNTER — Other Ambulatory Visit: Payer: Self-pay | Admitting: Family Medicine

## 2015-12-02 NOTE — Telephone Encounter (Signed)
Refill appropriate and filled per protocol. 

## 2015-12-25 ENCOUNTER — Other Ambulatory Visit: Payer: Self-pay | Admitting: Family Medicine

## 2016-01-31 ENCOUNTER — Encounter: Payer: Self-pay | Admitting: Family Medicine

## 2016-01-31 DIAGNOSIS — Z7251 High risk heterosexual behavior: Secondary | ICD-10-CM

## 2016-01-31 DIAGNOSIS — Z0000 Encounter for general adult medical examination without abnormal findings: Secondary | ICD-10-CM

## 2016-02-01 ENCOUNTER — Other Ambulatory Visit: Payer: Self-pay | Admitting: Family Medicine

## 2016-02-01 DIAGNOSIS — Z125 Encounter for screening for malignant neoplasm of prostate: Secondary | ICD-10-CM

## 2016-02-01 DIAGNOSIS — E119 Type 2 diabetes mellitus without complications: Secondary | ICD-10-CM

## 2016-02-01 DIAGNOSIS — Z0000 Encounter for general adult medical examination without abnormal findings: Secondary | ICD-10-CM

## 2016-02-01 NOTE — Addendum Note (Signed)
Addended by: Shary Decamp B on: 02/01/2016 08:09 AM   Modules accepted: Orders

## 2016-02-10 ENCOUNTER — Other Ambulatory Visit: Payer: No Typology Code available for payment source

## 2016-02-10 DIAGNOSIS — E119 Type 2 diabetes mellitus without complications: Secondary | ICD-10-CM

## 2016-02-10 DIAGNOSIS — Z7251 High risk heterosexual behavior: Secondary | ICD-10-CM

## 2016-02-10 DIAGNOSIS — Z0000 Encounter for general adult medical examination without abnormal findings: Secondary | ICD-10-CM

## 2016-02-10 DIAGNOSIS — Z125 Encounter for screening for malignant neoplasm of prostate: Secondary | ICD-10-CM

## 2016-02-10 LAB — CBC WITH DIFFERENTIAL/PLATELET
BASOS PCT: 1 %
Basophils Absolute: 88 cells/uL (ref 0–200)
EOS PCT: 4 %
Eosinophils Absolute: 352 cells/uL (ref 15–500)
HEMATOCRIT: 42.7 % (ref 38.5–50.0)
Hemoglobin: 14.5 g/dL (ref 13.0–17.0)
LYMPHS PCT: 20 %
Lymphs Abs: 1760 cells/uL (ref 850–3900)
MCH: 32.6 pg (ref 27.0–33.0)
MCHC: 34 g/dL (ref 32.0–36.0)
MCV: 96 fL (ref 80.0–100.0)
MONO ABS: 704 {cells}/uL (ref 200–950)
MPV: 10.5 fL (ref 7.5–12.5)
Monocytes Relative: 8 %
NEUTROS PCT: 67 %
Neutro Abs: 5896 cells/uL (ref 1500–7800)
PLATELETS: 276 10*3/uL (ref 140–400)
RBC: 4.45 MIL/uL (ref 4.20–5.80)
RDW: 14.1 % (ref 11.0–15.0)
WBC: 8.8 10*3/uL (ref 3.8–10.8)

## 2016-02-10 LAB — HEMOGLOBIN A1C
Hgb A1c MFr Bld: 7 % — ABNORMAL HIGH (ref <5.7)
MEAN PLASMA GLUCOSE: 154 mg/dL

## 2016-02-10 LAB — HM DIABETES EYE EXAM

## 2016-02-11 ENCOUNTER — Encounter: Payer: Self-pay | Admitting: *Deleted

## 2016-02-11 LAB — COMPREHENSIVE METABOLIC PANEL
ALK PHOS: 86 U/L (ref 40–115)
ALT: 99 U/L — AB (ref 9–46)
AST: 115 U/L — AB (ref 10–35)
Albumin: 4.3 g/dL (ref 3.6–5.1)
BILIRUBIN TOTAL: 1.2 mg/dL (ref 0.2–1.2)
BUN: 15 mg/dL (ref 7–25)
CO2: 21 mmol/L (ref 20–31)
CREATININE: 0.91 mg/dL (ref 0.70–1.33)
Calcium: 9.5 mg/dL (ref 8.6–10.3)
Chloride: 97 mmol/L — ABNORMAL LOW (ref 98–110)
GLUCOSE: 149 mg/dL — AB (ref 70–99)
Potassium: 4.2 mmol/L (ref 3.5–5.3)
SODIUM: 135 mmol/L (ref 135–146)
Total Protein: 6.9 g/dL (ref 6.1–8.1)

## 2016-02-11 LAB — LIPID PANEL
Cholesterol: 166 mg/dL (ref 125–200)
HDL: 57 mg/dL (ref >=40)
LDL CALC: 83 mg/dL (ref <130)
Total CHOL/HDL Ratio: 2.9 Ratio (ref <=5.0)
Triglycerides: 131 mg/dL (ref <150)
VLDL: 26 mg/dL (ref <30)

## 2016-02-11 LAB — MICROALBUMIN, URINE: MICROALB UR: 1.6 mg/dL

## 2016-02-11 LAB — HIV ANTIBODY (ROUTINE TESTING W REFLEX): HIV: NONREACTIVE

## 2016-02-11 LAB — HSV(HERPES SIMPLEX VRS) I + II AB-IGG: HSV 2 Glycoprotein G Ab, IgG: 15.1 Index — ABNORMAL HIGH (ref <0.90)

## 2016-02-11 LAB — PSA: PSA: 0.66 ng/mL (ref <=4.00)

## 2016-02-11 LAB — GC/CHLAMYDIA PROBE AMP
CT PROBE, AMP APTIMA: NOT DETECTED
GC PROBE AMP APTIMA: NOT DETECTED

## 2016-02-11 LAB — HEPATITIS C ANTIBODY: HCV Ab: NEGATIVE

## 2016-02-14 ENCOUNTER — Encounter: Payer: Self-pay | Admitting: Family Medicine

## 2016-02-14 ENCOUNTER — Ambulatory Visit (INDEPENDENT_AMBULATORY_CARE_PROVIDER_SITE_OTHER): Payer: No Typology Code available for payment source | Admitting: Family Medicine

## 2016-02-14 VITALS — BP 130/78 | HR 98 | Temp 98.2°F | Resp 16 | Ht 69.0 in | Wt 244.0 lb

## 2016-02-14 DIAGNOSIS — R7989 Other specified abnormal findings of blood chemistry: Secondary | ICD-10-CM

## 2016-02-14 DIAGNOSIS — E119 Type 2 diabetes mellitus without complications: Secondary | ICD-10-CM

## 2016-02-14 DIAGNOSIS — R945 Abnormal results of liver function studies: Secondary | ICD-10-CM

## 2016-02-14 DIAGNOSIS — I10 Essential (primary) hypertension: Secondary | ICD-10-CM | POA: Diagnosis not present

## 2016-02-14 NOTE — Progress Notes (Signed)
Subjective:    Patient ID: Leonard Garrett, male    DOB: March 02, 1966, 50 y.o.   MRN: [id number]  HPI   12/2014 Patient has tried diet exercise and weight loss however over the last few years, his hemoglobin A1c has steadily risen. He would like to start medication to reduce his blood sugar.  At that time, my plan was: begin metformin 500 mg by mouth twice a day. Recheck a CMP, fasting lipid panel, and hemoglobin A1c along with a urine microalbumin in 3 months.  08/17/15 The patient's cholesterol is excellent. His hemoglobin A1c has fallen from 6.8-6.5.  His colonoscopy is up-to-date. However he is due for digital rectal exam as well as a PSA , however, he would like [place] to check this at his next visit in 6 months. His blood pressure is significantly elevated today.  However he checks his blood pressure at home, and his blood pressures typically between 120 and 130/70-80. He has significant white coat syndrome. He states that he will start checking it regularly and give me an update in a few weeks. He is due for hepatitis C screening as well as HIV screening however he regularly donates blood at the [facility] and they screen him there and he is always been negative. His eye exam is up-to-date. He has his eyes examined by his ophthalmologist every 6 months after he had a retinal detachment. The remainder of his preventative care is up-to-date. His review of systems is otherwise negative.  At that time, my plan was: Diabetes and cholesterol are well controlled. Blood pressure significantly elevated today however this seems to be due to whitecoat syndrome. The patient will check his blood pressure frequently at home and notify me of the values to my chart. If greater than 140/90, I would add hydrochlorothiazide. He is due for hepatitis c screening as well as HIV screening however he declines this due to the fact he is screened at the [facility]. Colonoscopy is up-to-date. We will defer PSA and digital rectal  exam until next visit. He refuses a flu shot. He received Pneumovax today [name] is a diabetic.  02/14/16 He is here today for follow-up. His hemoglobin A1c has risen slightly. He states that he is following a low carbohydrate diet and he is trying to exercise. However I'm very concerned by the elevations in his liver function tests which continue to worsen. His most recent lab work as listed below. He is negative for hepatitis C. He is never had a right upper quadrant ultrasound. He is on allopurinol. He does drink alcohol every night. Abstract on 02/11/2016  Component Date Value Ref Range Status  . HM Diabetic Eye Exam 02/10/2016 No Retinopathy  No Retinopathy Final  Lab on 02/10/2016  Component Date Value Ref Range Status  . WBC 02/10/2016 8.8  3.8 - 10.8 K/uL Final  . RBC 02/10/2016 4.45  4.20 - 5.80 MIL/uL Final  . Hemoglobin 02/10/2016 14.5  13.0 - 17.0 g/dL Final  . HCT 02/10/2016 42.7  38.5 - 50.0 % Final  . MCV 02/10/2016 96.0  80.0 - 100.0 fL Final  . MCH 02/10/2016 32.6  27.0 - 33.0 pg Final  . MCHC 02/10/2016 34.0  32.0 - 36.0 g/dL Final  . RDW 02/10/2016 14.1  11.0 - 15.0 % Final  . Platelets 02/10/2016 276  140 - 400 K/uL Final  . MPV 02/10/2016 10.5  7.5 - 12.5 fL Final  . Neutro Abs 02/10/2016 5896  1500 - 7800 cells/uL Final  .  Lymphs Abs 02/10/2016 1760  850 - 3900 cells/uL Final  . Monocytes Absolute 02/10/2016 704  200 - 950 cells/uL Final  . Eosinophils Absolute 02/10/2016 352  15 - 500 cells/uL Final  . Basophils Absolute 02/10/2016 88  0 - 200 cells/uL Final  . Neutrophils Relative % 02/10/2016 67   Final  . Lymphocytes Relative 02/10/2016 20   Final  . Monocytes Relative 02/10/2016 8   Final  . Eosinophils Relative 02/10/2016 4   Final  . Basophils Relative 02/10/2016 1   Final  . Smear Review 02/10/2016 Criteria for review not met   Final   ** Please note change in unit of measure and reference range(s). **  . Sodium 02/10/2016 135  135 - 146 mmol/L Final  .  Potassium 02/10/2016 4.2  3.5 - 5.3 mmol/L Final  . Chloride 02/10/2016 97* 98 - 110 mmol/L Final  . CO2 02/10/2016 21  20 - 31 mmol/L Final  . Glucose, Bld 02/10/2016 149* 70 - 99 mg/dL Final  . BUN 02/10/2016 15  7 - 25 mg/dL Final  . Creat 02/10/2016 0.91  0.70 - 1.33 mg/dL Final   Comment:   For patients > or = 52 years of age: The upper reference limit for Creatinine is approximately 13% higher for people identified as African-American.     . Total Bilirubin 02/10/2016 1.2  0.2 - 1.2 mg/dL Final  . Alkaline Phosphatase 02/10/2016 86  40 - 115 U/L Final  . AST 02/10/2016 115* 10 - 35 U/L Final  . ALT 02/10/2016 99* 9 - 46 U/L Final  . Total Protein 02/10/2016 6.9  6.1 - 8.1 g/dL Final  . Albumin 02/10/2016 4.3  3.6 - 5.1 g/dL Final  . Calcium 02/10/2016 9.5  8.6 - 10.3 mg/dL Final  . Cholesterol 02/10/2016 166  125 - 200 mg/dL Final  . Triglycerides 02/10/2016 131  <150 mg/dL Final  . HDL 02/10/2016 57  >=40 mg/dL Final  . Total CHOL/HDL Ratio 02/10/2016 2.9  <=5.0 Ratio Final  . VLDL 02/10/2016 26  <30 mg/dL Final  . LDL Cholesterol 02/10/2016 83  <130 mg/dL Final   Comment:   Total Cholesterol/HDL Ratio:CHD Risk                        Coronary Heart Disease Risk Table                                        Men       Women          1/2 Average Risk              3.4        3.3              Average Risk              5.0        4.4           2X Average Risk              9.6        7.1           3X Average Risk             23.4       11.0 Use the calculated Patient Ratio above and the CHD Risk table  to determine the patient's CHD Risk.   . PSA 02/10/2016 0.66  <=4.00 ng/mL Final   Comment: Test Methodology: ECLIA PSA (Electrochemiluminescence Immunoassay)   For PSA values from 2.5-4.0, particularly in younger men <60 years old, the AUA and NCCN suggest testing for % Free PSA (3515) and evaluation of the rate of increase in PSA (PSA velocity).   . HIV 1&2 Ab, 4th  Generation 02/10/2016 NONREACTIVE  NONREACTIVE Final   Comment:   HIV-1 antigen and HIV-1/HIV-2 antibodies were not detected.  There is no laboratory evidence of HIV infection.   HIV-1/2 Antibody Diff        Not indicated. HIV-1 RNA, Qual TMA          Not indicated.     PLEASE NOTE: This information has been disclosed to you from records whose confidentiality may be protected by state law. If your state requires such protection, then the state law prohibits you from making any further disclosure of the information without the specific written consent of the person to whom it pertains, or as otherwise permitted by law. A general authorization for the release of medical or other information is NOT sufficient for this purpose.   The performance of this assay has not been clinically validated in patients less than 52 years old.   For additional information please refer to http://education.questdiagnostics.com/faq/FAQ106.  (This link is being provided for informational/educational purposes only.)     . Hgb A1c MFr Bld 02/10/2016 7.0* <5.7 % Final   Comment:   For someone without known diabetes, a hemoglobin A1c value of 6.5% or greater indicates that they may have diabetes and this should be confirmed with a follow-up test.   For someone with known diabetes, a value <7% indicates that their diabetes is well controlled and a value greater than or equal to 7% indicates suboptimal control. A1c targets should be individualized based on duration of diabetes, age, comorbid conditions, and other considerations.   Currently, no consensus exists for use of hemoglobin A1c for diagnosis of diabetes for children.     . Mean Plasma Glucose 02/10/2016 154   Final  . Microalb, Ur 02/10/2016 1.6  Not estab mg/dL Final   Comment: The ADA has defined abnormalities in albumin excretion as follows:           Category           Result                            (mcg/mg creatinine)                  Normal:    <30       Microalbuminuria:    30 - 299   Clinical albuminuria:    > or = 300   The ADA recommends that at least two of three specimens collected within a 3 - 6 month period be abnormal before considering a patient to be within a diagnostic category.     . HSV 1 Glycoprotein G Ab, IgG 02/10/2016 <0.90  <0.90 Index Final   Comment:                      Index             Interpretation                    =====             ==============                    <   0.90               NEGATIVE                    0.90 - 1.09          EQUIVOCAL                    > 1.09               POSITIVE   . HSV 2 Glycoprotein G Ab, IgG 02/10/2016 15.10* <0.90 Index Final   Comment:                      Index             Interpretation                    =====             ==============                    < 0.90               NEGATIVE                    0.90 - 1.09          EQUIVOCAL                    > 1.09               POSITIVE This assay utilizes recombinant type-specific antigens to differentiate HSV-1 from HSV-2 infections. A positive result cannot distinguish between recent and past infection. If recent HSV infection is suspected but the results are negative or equivocal, the assay should be repeated in 4-6 weeks. The performance characteristics of the assay have not been established for pediatric populations, immunocompromised patients, or neonatal screening.     . CT Probe RNA 02/10/2016 NOT DETECTED   Final   Comment:                    **Normal Reference Range: NOT DETECTED**   This test was performed using the APTIMA COMBO2 Assay ([employer]   The analytical performance characteristics of this assay, when used to test SurePath specimens have been determined by [employer]     . GC Probe RNA 02/10/2016 NOT DETECTED   Final   Comment:                    **Normal Reference Range: NOT DETECTED**   This test was performed using the APTIMA COMBO2 Assay  ([employer]   The analytical performance characteristics of this assay, when used to test SurePath specimens have been determined by [employer]     . HCV Ab 02/10/2016 NEGATIVE  NEGATIVE Final    Past Medical History  Diagnosis Date  . Hypertension   . Hyperlipidemia   . Prediabetes   . Seasonal allergies    Past Surgical History  Procedure Laterality Date  . Tonsillectomy  1970  . Closed reduction forearm fracture Left 1978  . Refractive surgery  1998, 2002  . Retinal detachment surgery  2011  . Knee arthroscopy Right 06/2010  . Vasectomy  1999   Current Outpatient Prescriptions on File Prior to Visit  Medication Sig Dispense Refill  . allopurinol (ZYLOPRIM) 300 MG tablet TAKE 1 TABLET BY MOUTH EVERY DAY FOR  GOUT 30 tablet 5  . amLODipine (NORVASC) 5 MG tablet Take 1 tablet (5 mg total) by mouth daily. 90 tablet 2  . aspirin 81 MG tablet Take 81 mg by mouth daily.    . irbesartan (AVAPRO) 300 MG tablet TAKE 1 TABLET BY MOUTH EVERY NIGHT AT BEDTIME 30 tablet 5  . metFORMIN (GLUCOPHAGE) 500 MG tablet TAKE 1 TABLET(500 MG) BY MOUTH TWICE DAILY WITH A MEAL 180 tablet 1  . Multiple Vitamins-Minerals (MULTIVITAMIN PO) Take by mouth.    . Omega-3 Fatty Acids (FISH OIL) 1200 MG CPDR Take by mouth.     No current facility-administered medications on file prior to visit.   No Known Allergies Social History   Social History  . Marital Status: Widowed    Spouse Name: N/A  . Number of Children: N/A  . Years of Education: N/A   Occupational History  . Not on file.   Social History Main Topics  . Smoking status: Never Smoker   . Smokeless tobacco: Never Used  . Alcohol Use: 4.2 oz/week    7 Standard drinks or equivalent per week  . Drug Use: No  . Sexual Activity: Not on file   Other Topics Concern  . Not on file   Social History Narrative     Review of Systems  All other systems reviewed and are negative.      Objective:   Physical Exam   Constitutional: He appears well-developed and well-nourished.  Neck: Neck supple.  Cardiovascular: Normal rate, regular rhythm and normal heart sounds.   No murmur heard. Pulmonary/Chest: Effort normal and breath sounds normal. No respiratory distress. He has no wheezes. He has no rales.  Abdominal: Soft. Bowel sounds are normal. He exhibits no distension. There is no tenderness. There is no rebound and no guarding.  Musculoskeletal: He exhibits no edema.  Lymphadenopathy:    He has no cervical adenopathy.  Vitals reviewed.         Assessment & Plan:  Diabetes mellitus type II, non insulin dependent (HCC)  Benign essential HTN  Elevated LFTs - Plan: US Abdomen Limited RUQ  His blood pressures acceptable. His cholesterol is acceptable. I am concerned by his elevation in liver function tests. I recommended discontinuation of alcohol and allopurinol. I will obtain a right upper quadrant ultrasound. If the ultrasound is negative, I would recheck liver function test in one month off allopurinol. If improved, I would find an alternative such as uloric.  Consider jardiance for DMII.

## 2016-02-25 ENCOUNTER — Ambulatory Visit
Admission: RE | Admit: 2016-02-25 | Discharge: 2016-02-25 | Disposition: A | Discharge status: Home / Self Care | Payer: PRIVATE HEALTH INSURANCE | Source: Ambulatory Visit | Attending: Family Medicine | Admitting: Family Medicine

## 2016-02-25 DIAGNOSIS — R945 Abnormal results of liver function studies: Principal | ICD-10-CM

## 2016-02-25 DIAGNOSIS — R7989 Other specified abnormal findings of blood chemistry: Secondary | ICD-10-CM

## 2016-02-28 ENCOUNTER — Encounter: Payer: Self-pay | Admitting: Family Medicine

## 2016-05-14 ENCOUNTER — Other Ambulatory Visit: Payer: Self-pay | Admitting: Family Medicine

## 2016-07-03 ENCOUNTER — Other Ambulatory Visit: Payer: Self-pay | Admitting: Family Medicine

## 2016-08-17 ENCOUNTER — Other Ambulatory Visit: Payer: Self-pay | Admitting: Family Medicine

## 2016-09-18 ENCOUNTER — Other Ambulatory Visit: Payer: Self-pay | Admitting: Family Medicine

## 2016-09-24 ENCOUNTER — Other Ambulatory Visit: Payer: Self-pay | Admitting: Family Medicine

## 2016-11-08 ENCOUNTER — Other Ambulatory Visit: Payer: Self-pay | Admitting: Family Medicine

## 2016-11-08 NOTE — Telephone Encounter (Signed)
Medication refill for one time only.  Patient needs to be seen.  Letter sent for patient to call and schedule 

## 2016-11-10 LAB — HM DIABETES EYE EXAM

## 2016-11-15 ENCOUNTER — Other Ambulatory Visit: Payer: Self-pay | Admitting: Family Medicine

## 2016-11-15 ENCOUNTER — Other Ambulatory Visit: Payer: No Typology Code available for payment source

## 2016-11-15 DIAGNOSIS — Z79899 Other long term (current) drug therapy: Secondary | ICD-10-CM

## 2016-11-15 DIAGNOSIS — E785 Hyperlipidemia, unspecified: Secondary | ICD-10-CM

## 2016-11-15 DIAGNOSIS — R7303 Prediabetes: Secondary | ICD-10-CM

## 2016-11-15 DIAGNOSIS — I10 Essential (primary) hypertension: Secondary | ICD-10-CM | POA: Insufficient documentation

## 2016-11-15 LAB — CBC WITH DIFFERENTIAL/PLATELET
BASOS PCT: 1 %
Basophils Absolute: 72 cells/uL (ref 0–200)
EOS PCT: 2 %
Eosinophils Absolute: 144 cells/uL (ref 15–500)
HEMATOCRIT: 36.7 % — AB (ref 38.5–50.0)
HEMOGLOBIN: 12.4 g/dL — AB (ref 13.0–17.0)
Lymphocytes Relative: 25 %
Lymphs Abs: 1800 cells/uL (ref 850–3900)
MCH: 32.9 pg (ref 27.0–33.0)
MCHC: 33.8 g/dL (ref 32.0–36.0)
MCV: 97.3 fL (ref 80.0–100.0)
MONO ABS: 792 {cells}/uL (ref 200–950)
MPV: 9.5 fL (ref 7.5–12.5)
Monocytes Relative: 11 %
NEUTROS ABS: 4392 {cells}/uL (ref 1500–7800)
Neutrophils Relative %: 61 %
Platelets: 243 10*3/uL (ref 140–400)
RBC: 3.77 MIL/uL — AB (ref 4.20–5.80)
RDW: 13.5 % (ref 11.0–15.0)
WBC: 7.2 10*3/uL (ref 3.8–10.8)

## 2016-11-15 LAB — COMPLETE METABOLIC PANEL WITHOUT GFR
ALT: 160 U/L — ABNORMAL HIGH (ref 9–46)
AST: 114 U/L — ABNORMAL HIGH (ref 10–35)
Albumin: 4 g/dL (ref 3.6–5.1)
Alkaline Phosphatase: 71 U/L (ref 40–115)
BUN: 13 mg/dL (ref 7–25)
CO2: 28 mmol/L (ref 20–31)
Calcium: 9.9 mg/dL (ref 8.6–10.3)
Chloride: 101 mmol/L (ref 98–110)
Creat: 1.13 mg/dL (ref 0.70–1.33)
GFR, Est African American: 86 mL/min
GFR, Est Non African American: 74 mL/min
Glucose, Bld: 138 mg/dL — ABNORMAL HIGH (ref 70–99)
Potassium: 4.7 mmol/L (ref 3.5–5.3)
Sodium: 139 mmol/L (ref 135–146)
Total Bilirubin: 0.7 mg/dL (ref 0.2–1.2)
Total Protein: 6.6 g/dL (ref 6.1–8.1)

## 2016-11-15 LAB — LIPID PANEL
Cholesterol: 165 mg/dL
HDL: 54 mg/dL
LDL Cholesterol: 81 mg/dL
Total CHOL/HDL Ratio: 3.1 ratio
Triglycerides: 152 mg/dL — ABNORMAL HIGH
VLDL: 30 mg/dL

## 2016-11-16 LAB — HEMOGLOBIN A1C
HEMOGLOBIN A1C: 6.2 % — AB (ref <5.7)
MEAN PLASMA GLUCOSE: 131 mg/dL

## 2016-11-17 ENCOUNTER — Encounter: Payer: Self-pay | Admitting: Family Medicine

## 2016-11-20 ENCOUNTER — Ambulatory Visit (INDEPENDENT_AMBULATORY_CARE_PROVIDER_SITE_OTHER): Payer: No Typology Code available for payment source | Admitting: Family Medicine

## 2016-11-20 ENCOUNTER — Encounter: Payer: Self-pay | Admitting: Family Medicine

## 2016-11-20 VITALS — BP 158/90 | HR 80 | Temp 97.7°F | Resp 16 | Ht 69.0 in | Wt 232.0 lb

## 2016-11-20 DIAGNOSIS — I10 Essential (primary) hypertension: Secondary | ICD-10-CM

## 2016-11-20 DIAGNOSIS — E119 Type 2 diabetes mellitus without complications: Secondary | ICD-10-CM | POA: Diagnosis not present

## 2016-11-20 DIAGNOSIS — R7989 Other specified abnormal findings of blood chemistry: Secondary | ICD-10-CM | POA: Diagnosis not present

## 2016-11-20 DIAGNOSIS — Z23 Encounter for immunization: Secondary | ICD-10-CM

## 2016-11-20 DIAGNOSIS — R945 Abnormal results of liver function studies: Secondary | ICD-10-CM

## 2016-11-20 NOTE — Addendum Note (Signed)
Addended by: Shary Decamp B on: 11/20/2016 05:12 PM   Modules accepted: Orders

## 2016-11-20 NOTE — Progress Notes (Signed)
Subjective:    Patient ID: Leonard Garrett, male    DOB: 08/01/1966, 51 y.o.   MRN: [id number]  HPI  12/2014 Patient has tried diet exercise and weight loss however over the last few years, his hemoglobin A1c has steadily risen. He would like to start medication to reduce his blood sugar.  At that time, my plan was: begin metformin 500 mg by mouth twice a day. Recheck a CMP, fasting lipid panel, and hemoglobin A1c along with a urine microalbumin in 3 months.  08/17/15 The patient's cholesterol is excellent. His hemoglobin A1c has fallen from 6.8-6.5.  His colonoscopy is up-to-date. However he is due for digital rectal exam as well as a PSA , however, he would like [place] to check this at his next visit in 6 months. His blood pressure is significantly elevated today.  However he checks his blood pressure at home, and his blood pressures typically between 120 and 130/70-80. He has significant white coat syndrome. He states that he will start checking it regularly and give me an update in a few weeks. He is due for hepatitis C screening as well as HIV screening however he regularly donates blood at the [facility] and they screen him there and he is always been negative. His eye exam is up-to-date. He has his eyes examined by his ophthalmologist every 6 months after he had a retinal detachment. The remainder of his preventative care is up-to-date. His review of systems is otherwise negative.  At that time, my plan was: Diabetes and cholesterol are well controlled. Blood pressure significantly elevated today however this seems to be due to whitecoat syndrome. The patient will check his blood pressure frequently at home and notify me of the values to my chart. If greater than 140/90, I would add hydrochlorothiazide. He is due for hepatitis c screening as well as HIV screening however he declines this due to the fact he is screened at the [facility]. Colonoscopy is up-to-date. We will defer PSA and digital rectal exam  until next visit. He refuses a flu shot. He received Pneumovax today [name] is a diabetic.  02/14/16 He is here today for follow-up. His hemoglobin A1c has risen slightly. He states that he is following a low carbohydrate diet and he is trying to exercise. However I'm very concerned by the elevations in his liver function tests which continue to worsen. His most recent lab work as listed below. He is negative for hepatitis C. He is never had a right upper quadrant ultrasound. He is on allopurinol. He does drink alcohol every night.  At that time, my plan was: His blood pressures acceptable. His cholesterol is acceptable. I am concerned by his elevation in liver function tests. I recommended discontinuation of alcohol and allopurinol. I will obtain a right upper quadrant ultrasound. If the ultrasound is negative, I would recheck liver function test in one month off allopurinol. If improved, I would find an alternative such as uloric.  Consider jardiance for DMII.  11/20/16 Patient returns today for follow-up. His lab work below shows an improvement in his hemoglobin A1c. His cholesterol is excellent. Unfortunately his liver function test or worsening. Patient today admits that he is an alcoholic. Apparently, over the weekend, the patient's family [name] intervention and stated that he needed to get help. He admits that he is drinking entirely too much. He is already scheduled an appointment this afternoon on his own to meet with a substance abuse counselor with an alcohol treatment program.  He acknowledges that this is the reason his liver function test are likely elevated. His blood pressure today is also elevated. However he is very anxious. He states that he is extremely nervous admitting that he is an alcoholic. He has been in denial for quite some time. He believes that his interaction with his family and the process of admitting that he has a problem is causing his blood pressure to be high. He states that  he normally checks his blood pressure at home and finds it 856 to 3:14 systolic. He believes that today is a false positive. Abstract on 11/17/2016  Component Date Value Ref Range Status  . HM Diabetic Eye Exam 11/10/2016 No Retinopathy  No Retinopathy Final  Appointment on 11/15/2016  Component Date Value Ref Range Status  . Sodium 11/15/2016 139  135 - 146 mmol/L Final  . Potassium 11/15/2016 4.7  3.5 - 5.3 mmol/L Final  . Chloride 11/15/2016 101  98 - 110 mmol/L Final  . CO2 11/15/2016 28  20 - 31 mmol/L Final  . Glucose, Bld 11/15/2016 138* 70 - 99 mg/dL Final  . BUN 11/15/2016 13  7 - 25 mg/dL Final  . Creat 11/15/2016 1.13  0.70 - 1.33 mg/dL Final   Comment:   For patients > or = 53 years of age: The upper reference limit for Creatinine is approximately 13% higher for people identified as African-American.     . Total Bilirubin 11/15/2016 0.7  0.2 - 1.2 mg/dL Final  . Alkaline Phosphatase 11/15/2016 71  40 - 115 U/L Final  . AST 11/15/2016 114* 10 - 35 U/L Final  . ALT 11/15/2016 160* 9 - 46 U/L Final  . Total Protein 11/15/2016 6.6  6.1 - 8.1 g/dL Final  . Albumin 11/15/2016 4.0  3.6 - 5.1 g/dL Final  . Calcium 11/15/2016 9.9  8.6 - 10.3 mg/dL Final  . GFR, Est African American 11/15/2016 86  >=60 mL/min Final  . GFR, Est Non African American 11/15/2016 74  >=60 mL/min Final  . Cholesterol 11/15/2016 165  <200 mg/dL Final  . Triglycerides 11/15/2016 152* <150 mg/dL Final  . HDL 11/15/2016 54  >40 mg/dL Final  . Total CHOL/HDL Ratio 11/15/2016 3.1  <5.0 Ratio Final  . VLDL 11/15/2016 30  <30 mg/dL Final  . LDL Cholesterol 11/15/2016 81  <100 mg/dL Final  . WBC 11/15/2016 7.2  3.8 - 10.8 K/uL Final  . RBC 11/15/2016 3.77* 4.20 - 5.80 MIL/uL Final  . Hemoglobin 11/15/2016 12.4* 13.0 - 17.0 g/dL Final  . HCT 11/15/2016 36.7* 38.5 - 50.0 % Final  . MCV 11/15/2016 97.3  80.0 - 100.0 fL Final  . MCH 11/15/2016 32.9  27.0 - 33.0 pg Final  . MCHC 11/15/2016 33.8  32.0 - 36.0  g/dL Final  . RDW 11/15/2016 13.5  11.0 - 15.0 % Final  . Platelets 11/15/2016 243  140 - 400 K/uL Final  . MPV 11/15/2016 9.5  7.5 - 12.5 fL Final  . Neutro Abs 11/15/2016 4392  1,500 - 7,800 cells/uL Final  . Lymphs Abs 11/15/2016 1800  850 - 3,900 cells/uL Final  . Monocytes Absolute 11/15/2016 792  200 - 950 cells/uL Final  . Eosinophils Absolute 11/15/2016 144  15 - 500 cells/uL Final  . Basophils Absolute 11/15/2016 72  0 - 200 cells/uL Final  . Neutrophils Relative % 11/15/2016 61  % Final  . Lymphocytes Relative 11/15/2016 25  % Final  . Monocytes Relative 11/15/2016 11  % Final  .  Eosinophils Relative 11/15/2016 2  % Final  . Basophils Relative 11/15/2016 1  % Final  . Smear Review 11/15/2016 Criteria for review not met   Final  . Hgb A1c MFr Bld 11/15/2016 6.2* <5.7 % Final   Comment:   For someone without known diabetes, a hemoglobin A1c value between 5.7% and 6.4% is consistent with prediabetes and should be confirmed with a follow-up test.   For someone with known diabetes, a value <7% indicates that their diabetes is well controlled. A1c targets should be individualized based on duration of diabetes, age, co-morbid conditions and other considerations.   This assay result is consistent with an increased risk of diabetes.   Currently, no consensus exists regarding use of hemoglobin A1c for diagnosis of diabetes in children.     . Mean Plasma Glucose 11/15/2016 131  mg/dL Final    Past Medical History:  Diagnosis Date  . Elevated LFTs    us-fatty infiltration (7/17)  . Hyperlipidemia   . Hypertension   . Prediabetes   . Seasonal allergies    Past Surgical History:  Procedure Laterality Date  . CLOSED REDUCTION FOREARM FRACTURE Left 1978  . KNEE ARTHROSCOPY Right 06/2010  . [place], 2002  . RETINAL DETACHMENT SURGERY  2011  . TONSILLECTOMY  1970  . VASECTOMY  1999   Current Outpatient Prescriptions on File Prior to Visit  Medication Sig  Dispense Refill  . amLODipine (NORVASC) 5 MG tablet TAKE 1 TABLET(5 MG) BY MOUTH DAILY 90 tablet 3  . aspirin 81 MG tablet Take 81 mg by mouth daily.    . irbesartan (AVAPRO) 300 MG tablet TAKE 1 TABLET BY MOUTH EVERY NIGHT AT BEDTIME 30 tablet 3  . metFORMIN (GLUCOPHAGE) 500 MG tablet TAKE 1 TABLET(500 MG) BY MOUTH TWICE DAILY WITH A MEAL 60 tablet 0  . Multiple Vitamins-Minerals (MULTIVITAMIN PO) Take by mouth.    . Omega-3 Fatty Acids (FISH OIL) 1200 MG CPDR Take by mouth.     No current facility-administered medications on file prior to visit.    No Known Allergies Social History   Social History  . Marital status: Widowed    Spouse name: N/A  . Number of children: N/A  . Years of education: N/A   Occupational History  . Not on file.   Social History Main Topics  . Smoking status: Never Smoker  . Smokeless tobacco: Never Used  . Alcohol use 4.2 oz/week    7 Standard drinks or equivalent per week  . Drug use: No  . Sexual activity: Not on file   Other Topics Concern  . Not on file   Social History Narrative  . No narrative on file     Review of Systems  All other systems reviewed and are negative.      Objective:   Physical Exam  Constitutional: He appears well-developed and well-nourished.  Neck: Neck supple.  Cardiovascular: Normal rate, regular rhythm and normal heart sounds.   No murmur heard. Pulmonary/Chest: Effort normal and breath sounds normal. No respiratory distress. He has no wheezes. He has no rales.  Abdominal: Soft. Bowel sounds are normal. He exhibits no distension. There is no tenderness. There is no rebound and no guarding.  Musculoskeletal: He exhibits no edema.  Lymphadenopathy:    He has no cervical adenopathy.  Vitals reviewed.         Assessment & Plan:  Diabetes mellitus type II, non insulin dependent (HCC)  Benign essential HTN  Elevated  LFTs  Diabetes is not adequately controlled and cholesterol is acceptable. I believe  the patient's liver function tests are secondary to his alcohol abuse. Patient is entering an alcohol treatment program and would like to be abstinent from alcohol for 3 months and then recheck his labs prior to any invasive testing. I believe this is acceptable. His blood pressure however concerns me. He refuses any antihypertensive medication at the present time other than what he is already taking. He believes this is due to anxiety. He would like to check his blood pressure everyday for the next few weeks and report the values to me in 2 weeks.

## 2016-12-03 ENCOUNTER — Other Ambulatory Visit: Payer: Self-pay | Admitting: Family Medicine

## 2017-01-02 ENCOUNTER — Other Ambulatory Visit: Payer: Self-pay | Admitting: Family Medicine

## 2017-01-31 ENCOUNTER — Other Ambulatory Visit: Payer: Self-pay | Admitting: Family Medicine

## 2017-02-19 ENCOUNTER — Other Ambulatory Visit: Payer: No Typology Code available for payment source

## 2017-02-19 DIAGNOSIS — R7989 Other specified abnormal findings of blood chemistry: Secondary | ICD-10-CM

## 2017-02-19 DIAGNOSIS — R945 Abnormal results of liver function studies: Principal | ICD-10-CM

## 2017-02-20 ENCOUNTER — Encounter: Payer: Self-pay | Admitting: Family Medicine

## 2017-02-20 LAB — COMPREHENSIVE METABOLIC PANEL
ALBUMIN: 4.2 g/dL (ref 3.6–5.1)
ALT: 34 U/L (ref 9–46)
AST: 25 U/L (ref 10–35)
Alkaline Phosphatase: 78 U/L (ref 40–115)
BUN: 12 mg/dL (ref 7–25)
CALCIUM: 9.8 mg/dL (ref 8.6–10.3)
CHLORIDE: 103 mmol/L (ref 98–110)
CO2: 27 mmol/L (ref 20–31)
Creat: 1.1 mg/dL (ref 0.70–1.33)
Glucose, Bld: 127 mg/dL — ABNORMAL HIGH (ref 70–99)
Potassium: 4.1 mmol/L (ref 3.5–5.3)
Sodium: 139 mmol/L (ref 135–146)
Total Bilirubin: 0.5 mg/dL (ref 0.2–1.2)
Total Protein: 6.8 g/dL (ref 6.1–8.1)

## 2017-02-22 ENCOUNTER — Other Ambulatory Visit: Payer: No Typology Code available for payment source

## 2017-02-22 ENCOUNTER — Other Ambulatory Visit: Payer: Self-pay | Admitting: Family Medicine

## 2017-02-22 DIAGNOSIS — E785 Hyperlipidemia, unspecified: Secondary | ICD-10-CM

## 2017-02-22 DIAGNOSIS — I10 Essential (primary) hypertension: Secondary | ICD-10-CM

## 2017-02-22 DIAGNOSIS — E119 Type 2 diabetes mellitus without complications: Secondary | ICD-10-CM

## 2017-02-22 LAB — CBC WITH DIFFERENTIAL/PLATELET
Basophils Absolute: 0 cells/uL (ref 0–200)
Basophils Relative: 0 %
EOS ABS: 496 {cells}/uL (ref 15–500)
Eosinophils Relative: 4 %
HEMATOCRIT: 44.3 % (ref 38.5–50.0)
HEMOGLOBIN: 15 g/dL (ref 13.0–17.0)
LYMPHS ABS: 1860 {cells}/uL (ref 850–3900)
Lymphocytes Relative: 15 %
MCH: 32.2 pg (ref 27.0–33.0)
MCHC: 33.9 g/dL (ref 32.0–36.0)
MCV: 95.1 fL (ref 80.0–100.0)
MONO ABS: 992 {cells}/uL — AB (ref 200–950)
MPV: 9.5 fL (ref 7.5–12.5)
Monocytes Relative: 8 %
NEUTROS PCT: 73 %
Neutro Abs: 9052 cells/uL — ABNORMAL HIGH (ref 1500–7800)
Platelets: 273 10*3/uL (ref 140–400)
RBC: 4.66 MIL/uL (ref 4.20–5.80)
RDW: 12.8 % (ref 11.0–15.0)
WBC: 12.4 10*3/uL — AB (ref 3.8–10.8)

## 2017-02-23 ENCOUNTER — Encounter: Payer: Self-pay | Admitting: Family Medicine

## 2017-02-23 LAB — LIPID PANEL
CHOL/HDL RATIO: 3.3 ratio (ref <5.0)
CHOLESTEROL: 148 mg/dL (ref <200)
HDL: 45 mg/dL (ref >40)
LDL Cholesterol: 75 mg/dL (ref <100)
TRIGLYCERIDES: 141 mg/dL (ref <150)
VLDL: 28 mg/dL (ref <30)

## 2017-02-23 LAB — HEMOGLOBIN A1C
HEMOGLOBIN A1C: 6.1 % — AB (ref <5.7)
MEAN PLASMA GLUCOSE: 128 mg/dL

## 2017-04-16 ENCOUNTER — Other Ambulatory Visit: Payer: Self-pay | Admitting: Family Medicine

## 2017-05-11 ENCOUNTER — Other Ambulatory Visit: Payer: Self-pay | Admitting: Family Medicine

## 2017-06-05 ENCOUNTER — Other Ambulatory Visit: Payer: Self-pay | Admitting: Family Medicine

## 2017-06-29 ENCOUNTER — Other Ambulatory Visit: Payer: Self-pay | Admitting: Family Medicine

## 2017-07-02 NOTE — Telephone Encounter (Signed)
Medication refilled per protocol. 

## 2017-07-20 ENCOUNTER — Other Ambulatory Visit: Payer: Self-pay | Admitting: Family Medicine

## 2017-07-28 ENCOUNTER — Other Ambulatory Visit: Payer: Self-pay | Admitting: Family Medicine

## 2017-08-09 ENCOUNTER — Other Ambulatory Visit: Payer: No Typology Code available for payment source

## 2017-08-09 IMAGING — US US ABDOMEN LIMITED
1 series · 14 of 25 positions shown · non-contrast
Comparison: None.

CLINICAL DATA: Elevated LFTs.

EXAM:
US ABDOMEN LIMITED - RIGHT UPPER QUADRANT

[Series 1: us abdomen limited · 0.28mm/px · 14 of 42 slices shown]
[im 1/42]
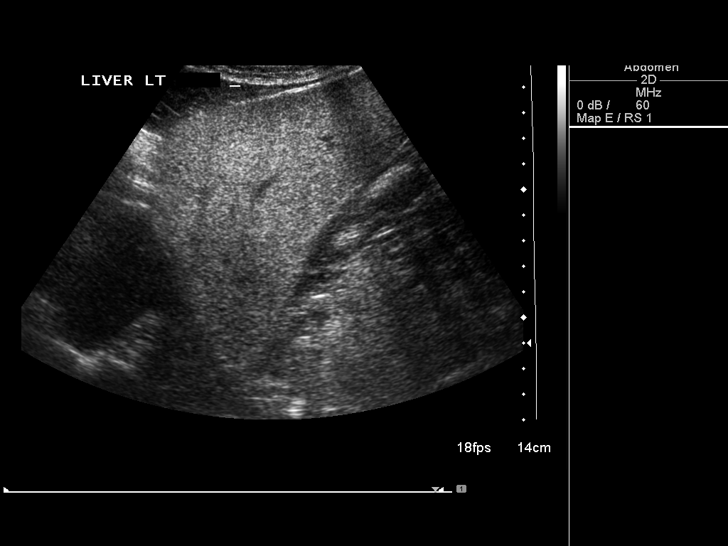
[im 4/42]
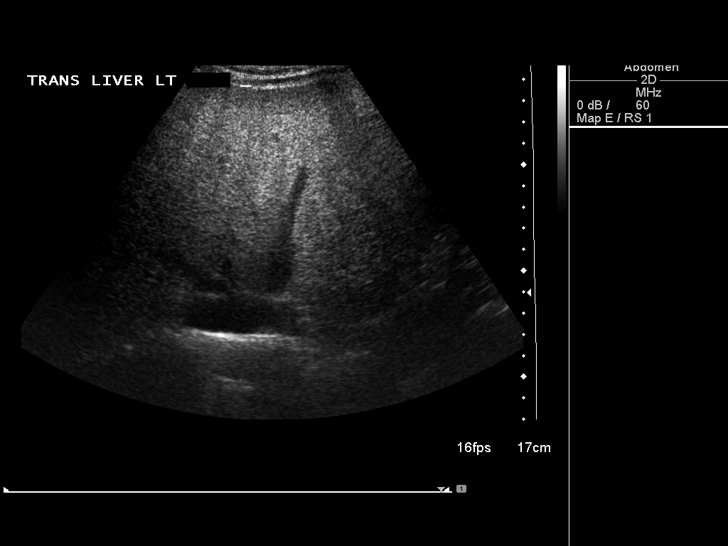
[im 7/42]
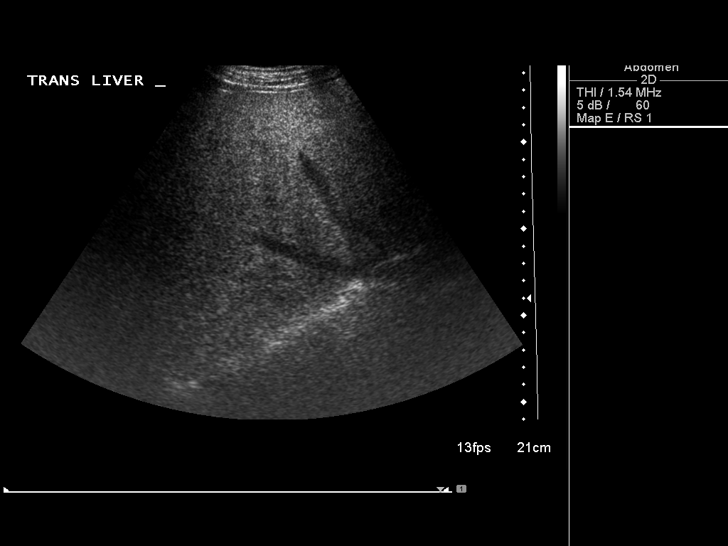
[im 11/42]
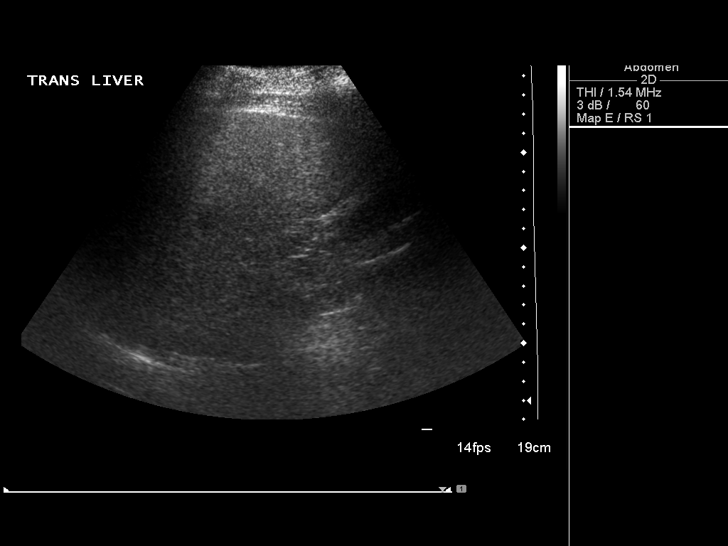
[im 14/42]
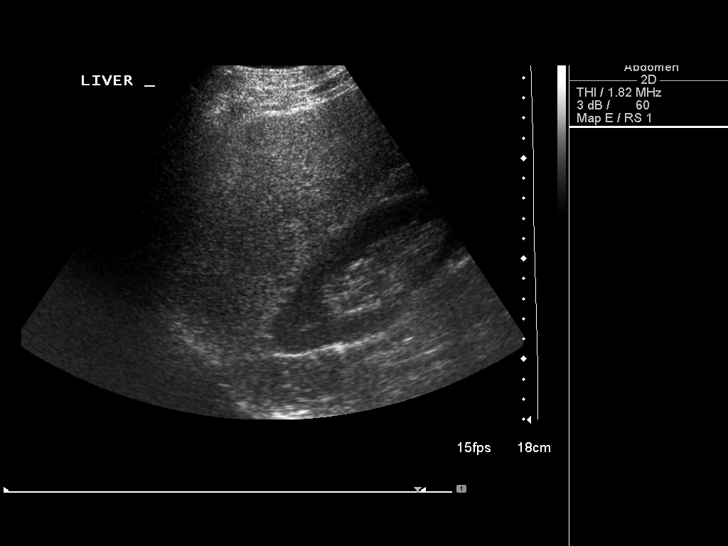
[im 16/42]
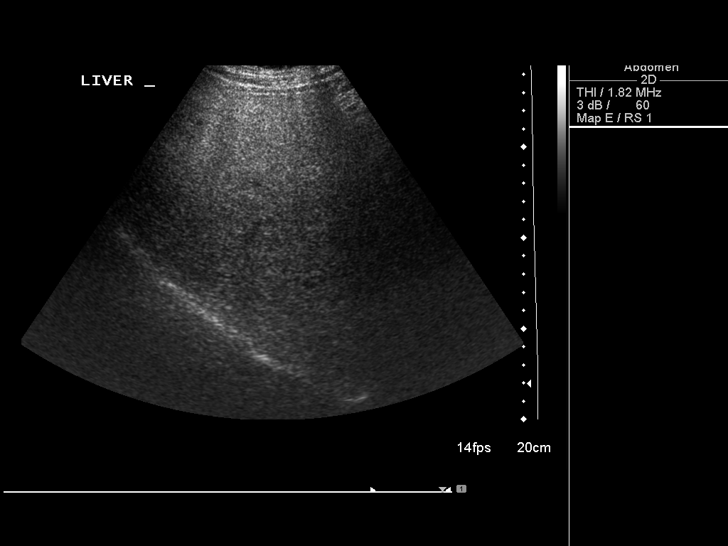
[im 19/42]
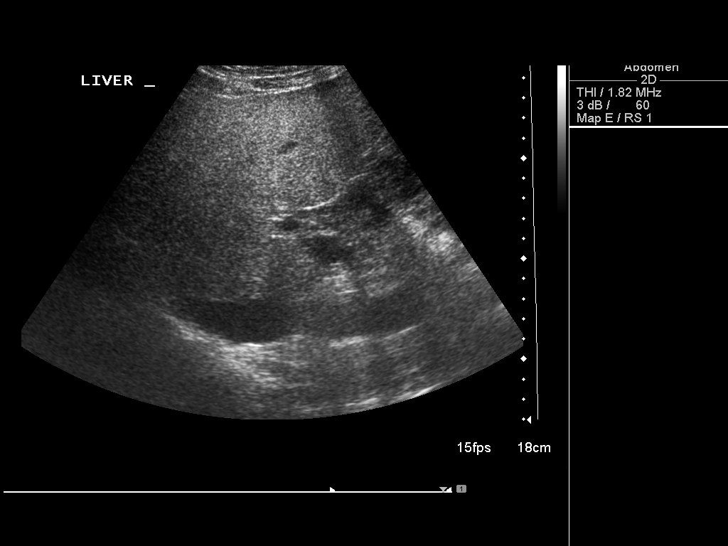
[im 23/42]
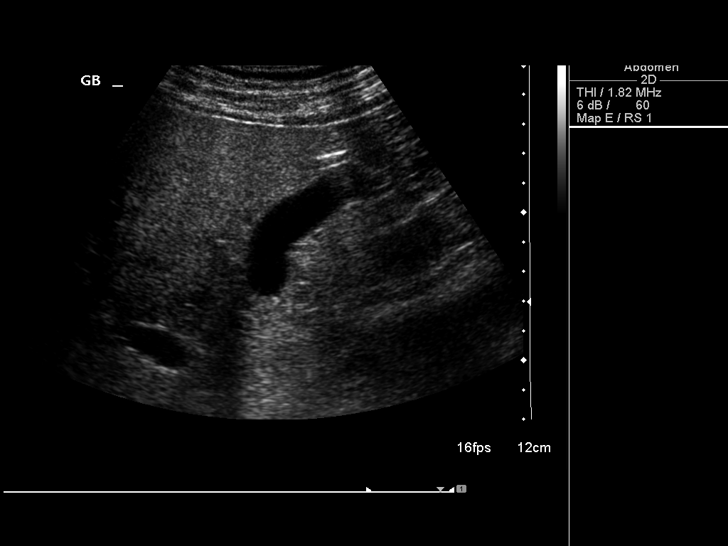
[im 26/42]
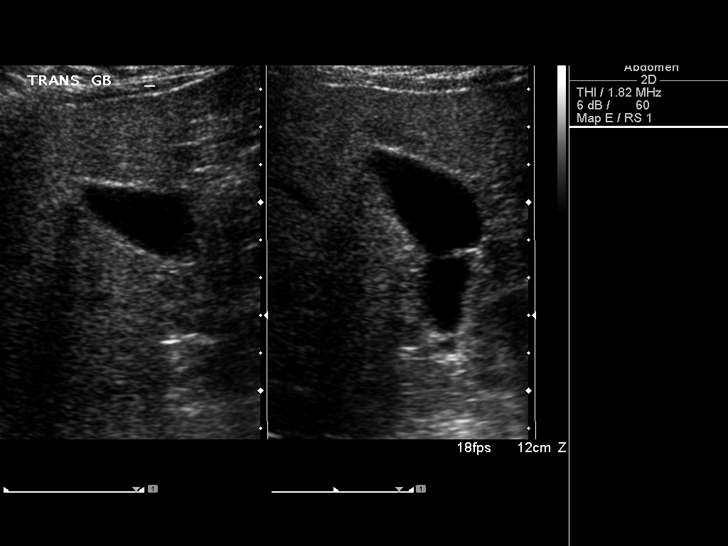
[im 28/42]
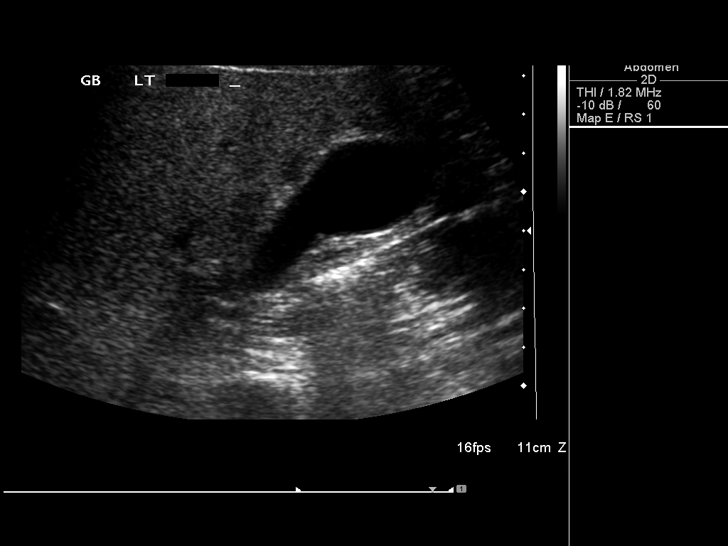
[im 31/42]
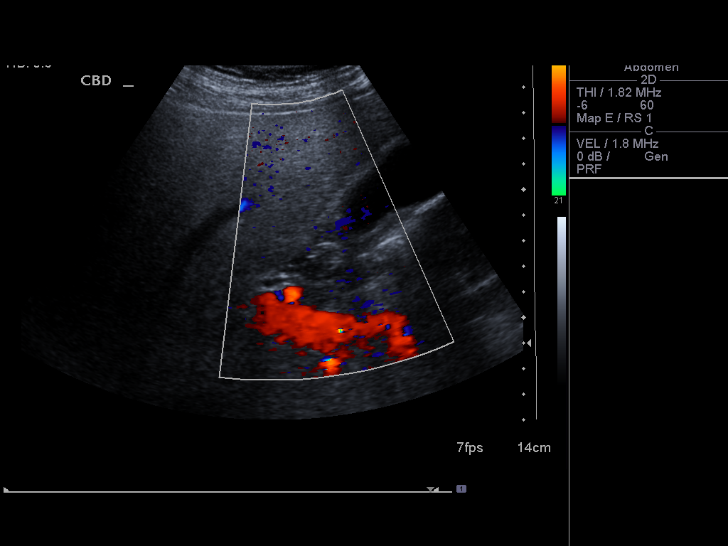
[im 35/42]
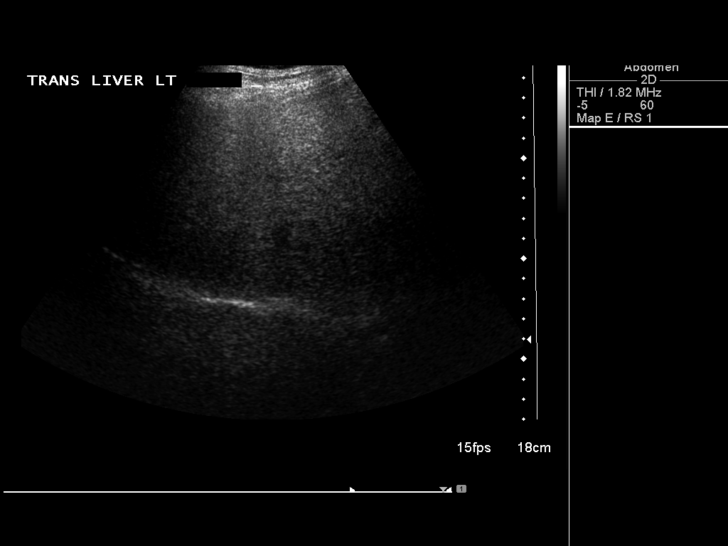
[im 38/42]
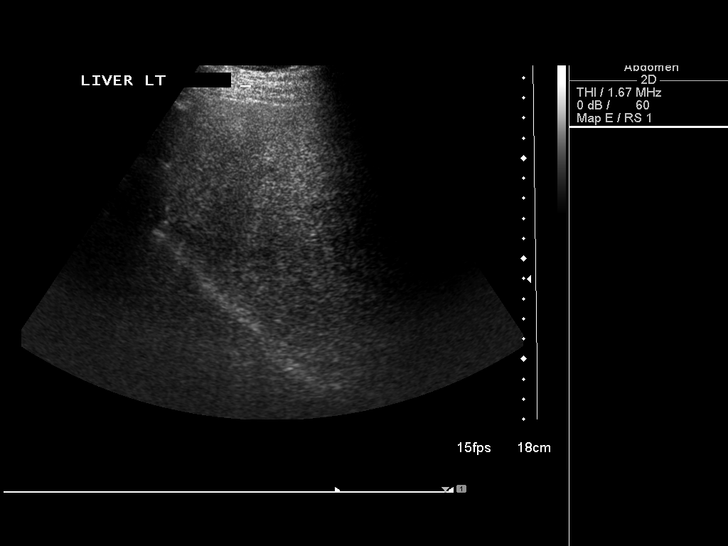
[im 42/42]
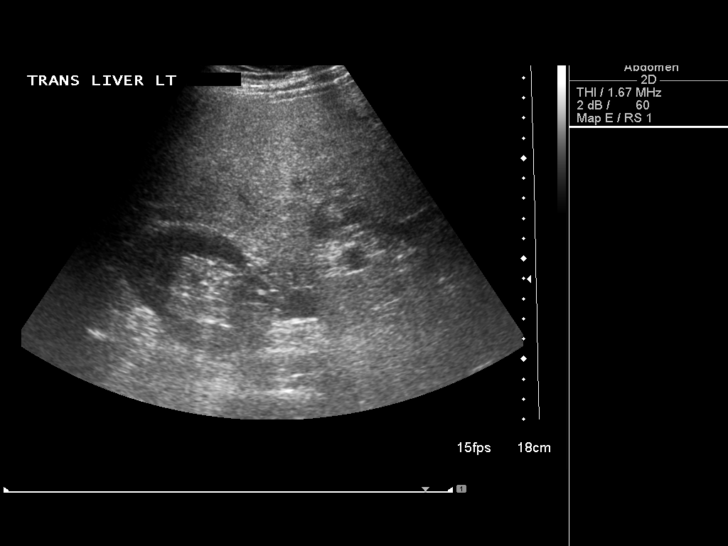

[14 of 25 positions shown; findings below may reference images not displayed]

FINDINGS: Gallbladder:

No gallstones or wall thickening visualized. No sonographic Murphy
sign noted by sonographer.

Common bile duct:

Diameter: 3.5 mm

Liver:

Liver is echogenic consistent fatty infiltration and/or
hepatocellular disease. No focal hepatic abnormality identified.
IMPRESSION: 1. Liver is echogenic consistent with fatty infiltration and/or
hepatocellular disease. No focal hepatic abnormality identified.

2. No gallstones or biliary distention.

## 2017-08-10 ENCOUNTER — Other Ambulatory Visit: Payer: Self-pay

## 2017-08-10 ENCOUNTER — Other Ambulatory Visit: Payer: No Typology Code available for payment source

## 2017-08-10 DIAGNOSIS — I10 Essential (primary) hypertension: Secondary | ICD-10-CM

## 2017-08-10 DIAGNOSIS — E785 Hyperlipidemia, unspecified: Secondary | ICD-10-CM

## 2017-08-10 DIAGNOSIS — R7303 Prediabetes: Secondary | ICD-10-CM

## 2017-08-11 LAB — CBC WITH DIFFERENTIAL/PLATELET
BASOS PCT: 1 %
Basophils Absolute: 72 cells/uL (ref 0–200)
EOS ABS: 252 {cells}/uL (ref 15–500)
Eosinophils Relative: 3.5 %
HEMATOCRIT: 38.6 % (ref 38.5–50.0)
Hemoglobin: 13.6 g/dL (ref 13.2–17.1)
LYMPHS ABS: 1858 {cells}/uL (ref 850–3900)
MCH: 33.1 pg — AB (ref 27.0–33.0)
MCHC: 35.2 g/dL (ref 32.0–36.0)
MCV: 93.9 fL (ref 80.0–100.0)
MPV: 10.1 fL (ref 7.5–12.5)
Monocytes Relative: 12.5 %
NEUTROS PCT: 57.2 %
Neutro Abs: 4118 cells/uL (ref 1500–7800)
Platelets: 236 10*3/uL (ref 140–400)
RBC: 4.11 10*6/uL — ABNORMAL LOW (ref 4.20–5.80)
RDW: 13.5 % (ref 11.0–15.0)
TOTAL LYMPHOCYTE: 25.8 %
WBC: 7.2 10*3/uL (ref 3.8–10.8)
WBCMIX: 900 {cells}/uL (ref 200–950)

## 2017-08-11 LAB — COMPLETE METABOLIC PANEL WITH GFR
AG Ratio: 1.6 (calc) (ref 1.0–2.5)
ALBUMIN MSPROF: 4.2 g/dL (ref 3.6–5.1)
ALT: 79 U/L — AB (ref 9–46)
AST: 66 U/L — ABNORMAL HIGH (ref 10–35)
Alkaline phosphatase (APISO): 78 U/L (ref 40–115)
BUN: 16 mg/dL (ref 7–25)
CALCIUM: 9.9 mg/dL (ref 8.6–10.3)
CO2: 28 mmol/L (ref 20–32)
Chloride: 100 mmol/L (ref 98–110)
Creat: 1.06 mg/dL (ref 0.70–1.33)
GFR, EST AFRICAN AMERICAN: 92 mL/min/{1.73_m2} (ref >=60)
GFR, EST NON AFRICAN AMERICAN: 80 mL/min/{1.73_m2} (ref >=60)
Globulin: 2.7 g/dL (calc) (ref 1.9–3.7)
Glucose, Bld: 135 mg/dL — ABNORMAL HIGH (ref 65–99)
POTASSIUM: 4.7 mmol/L (ref 3.5–5.3)
Sodium: 138 mmol/L (ref 135–146)
TOTAL PROTEIN: 6.9 g/dL (ref 6.1–8.1)
Total Bilirubin: 0.8 mg/dL (ref 0.2–1.2)

## 2017-08-11 LAB — LIPID PANEL
CHOLESTEROL: 180 mg/dL (ref <200)
HDL: 53 mg/dL (ref >40)
LDL Cholesterol (Calc): 100 mg/dL (calc) — ABNORMAL HIGH
NON-HDL CHOLESTEROL (CALC): 127 mg/dL (ref <130)
TRIGLYCERIDES: 172 mg/dL — AB (ref <150)
Total CHOL/HDL Ratio: 3.4 (calc) (ref <5.0)

## 2017-08-11 LAB — HEMOGLOBIN A1C
EAG (MMOL/L): 6.6 (calc)
Hgb A1c MFr Bld: 5.8 % of total Hgb — ABNORMAL HIGH (ref <5.7)
Mean Plasma Glucose: 120 (calc)

## 2017-08-13 ENCOUNTER — Other Ambulatory Visit: Payer: Self-pay | Admitting: Family Medicine

## 2017-08-14 ENCOUNTER — Encounter: Payer: Self-pay | Admitting: Family Medicine

## 2017-08-14 ENCOUNTER — Ambulatory Visit: Payer: No Typology Code available for payment source | Admitting: Family Medicine

## 2017-08-14 VITALS — BP 132/80 | HR 72 | Temp 98.3°F | Resp 16 | Ht 69.0 in | Wt 239.0 lb

## 2017-08-14 DIAGNOSIS — E119 Type 2 diabetes mellitus without complications: Secondary | ICD-10-CM

## 2017-08-14 DIAGNOSIS — I10 Essential (primary) hypertension: Secondary | ICD-10-CM

## 2017-08-14 DIAGNOSIS — E785 Hyperlipidemia, unspecified: Secondary | ICD-10-CM

## 2017-08-14 DIAGNOSIS — R945 Abnormal results of liver function studies: Secondary | ICD-10-CM

## 2017-08-14 DIAGNOSIS — R7989 Other specified abnormal findings of blood chemistry: Secondary | ICD-10-CM

## 2017-08-14 MED ORDER — IRBESARTAN 300 MG PO TABS: 300.0000 mg | ORAL_TABLET | Freq: Every day | ORAL | 3 refills | Status: DC

## 2017-08-14 NOTE — Progress Notes (Signed)
Subjective:    Patient ID: Leonard Garrett, male    DOB: 06/14/1966, 52 y.o.   MRN: [id number]  Medication Refill     12/2014 Patient has tried diet exercise and weight loss however over the last few years, his hemoglobin A1c has steadily risen. He would like to start medication to reduce his blood sugar.  At that time, my plan was: begin metformin 500 mg by mouth twice a day. Recheck a CMP, fasting lipid panel, and hemoglobin A1c along with a urine microalbumin in 3 months.  08/17/15 The patient's cholesterol is excellent. His hemoglobin A1c has fallen from 6.8-6.5.  His colonoscopy is up-to-date. However he is due for digital rectal exam as well as a PSA , however, he would like [place] to check this at his next visit in 6 months. His blood pressure is significantly elevated today.  However he checks his blood pressure at home, and his blood pressures typically between 120 and 130/70-80. He has significant white coat syndrome. He states that he will start checking it regularly and give me an update in a few weeks. He is due for hepatitis C screening as well as HIV screening however he regularly donates blood at the [facility] and they screen him there and he is always been negative. His eye exam is up-to-date. He has his eyes examined by his ophthalmologist every 6 months after he had a retinal detachment. The remainder of his preventative care is up-to-date. His review of systems is otherwise negative.  At that time, my plan was: Diabetes and cholesterol are well controlled. Blood pressure significantly elevated today however this seems to be due to whitecoat syndrome. The patient will check his blood pressure frequently at home and notify me of the values to my chart. If greater than 140/90, I would add hydrochlorothiazide. He is due for hepatitis c screening as well as HIV screening however he declines this due to the fact he is screened at the [facility]. Colonoscopy is up-to-date. We will defer PSA and  digital rectal exam until next visit. He refuses a flu shot. He received Pneumovax today [name] is a diabetic.  02/14/16 He is here today for follow-up. His hemoglobin A1c has risen slightly. He states that he is following a low carbohydrate diet and he is trying to exercise. However I'm very concerned by the elevations in his liver function tests which continue to worsen. His most recent lab work as listed below. He is negative for hepatitis C. He is never had a right upper quadrant ultrasound. He is on allopurinol. He does drink alcohol every night.  At that time, my plan was: His blood pressures acceptable. His cholesterol is acceptable. I am concerned by his elevation in liver function tests. I recommended discontinuation of alcohol and allopurinol. I will obtain a right upper quadrant ultrasound. If the ultrasound is negative, I would recheck liver function test in one month off allopurinol. If improved, I would find an alternative such as uloric.  Consider jardiance for DMII.  11/20/16 Patient returns today for follow-up. His lab work below shows an improvement in his hemoglobin A1c. His cholesterol is excellent. Unfortunately his liver function test or worsening. Patient today admits that he is an alcoholic. Apparently, over the weekend, the patient's family [name] intervention and stated that he needed to get help. He admits that he is drinking entirely too much. He is already scheduled an appointment this afternoon on his own to meet with a substance abuse counselor with  an alcohol treatment program. He acknowledges that this is the reason his liver function test are likely elevated. His blood pressure today is also elevated. However he is very anxious. He states that he is extremely nervous admitting that he is an alcoholic. He has been in denial for quite some time. He believes that his interaction with his family and the process of admitting that he has a problem is causing his blood pressure to be  high. He states that he normally checks his blood pressure at home and finds it 858 to 8:50 systolic. He believes that today is a false positive.  At that time, my plan was: Diabetes is adequately controlled and cholesterol is acceptable. I believe the patient's liver function tests are secondary to his alcohol abuse. Patient is entering an alcohol treatment program and would like to be abstinent from alcohol for 3 months and then recheck his labs prior to any invasive testing. I believe this is acceptable. His blood pressure however concerns me. He refuses any antihypertensive medication at the present time other than what he is already taking. He believes this is due to anxiety. He would like to check his blood pressure everyday for the next few weeks and report the values to me in 2 weeks.    08/14/17 Patient admits that he started drinking again. As a result his liver function tests have increased. He is going to addiction specialist and also a counseling group to help with his alcohol abuse. However he is drinking. His blood pressure today is outstanding at 132/80. His hemoglobin A1c is much better at 5.8. His LDL cholesterol is acceptable at 100 and the patient refuses to take a statin. I would be hesitant to use a statin given his alcohol use. Appointment on 08/10/2017  Component Date Value Ref Range Status  . Cholesterol 08/10/2017 180  <200 mg/dL Final  . HDL 08/10/2017 53  >40 mg/dL Final  . Triglycerides 08/10/2017 172* <150 mg/dL Final  . LDL Cholesterol (Calc) 08/10/2017 100* mg/dL (calc) Final   Comment: Reference range: <100 . Desirable range <100 mg/dL for primary prevention;   <70 mg/dL for patients with CHD or diabetic patients  with > or = 2 CHD risk factors. Marland Kitchen LDL-C is now calculated using the Martin-Hopkins  calculation, which is a validated novel method providing  better accuracy than the Friedewald equation in the  estimation of LDL-C.  Cresenciano Genre et al. Annamaria Helling. 2774;128(78):  2061-2068  (http://education.[facility].com/faq/FAQ164)   . Total CHOL/HDL Ratio 08/10/2017 3.4  <5.0 (calc) Final  . Non-HDL Cholesterol (Calc) 08/10/2017 127  <130 mg/dL (calc) Final   Comment: For patients with diabetes plus 1 major ASCVD risk  factor, treating to a non-HDL-C goal of <100 mg/dL  (LDL-C of <70 mg/dL) is considered a therapeutic  option.   . Glucose, Bld 08/10/2017 135* 65 - 99 mg/dL Final   Comment: .            Fasting reference interval . For someone without known diabetes, a glucose value >125 mg/dL indicates that they may have diabetes and this should be confirmed with a follow-up test. .   . BUN 08/10/2017 16  7 - 25 mg/dL Final  . Creat 08/10/2017 1.06  0.70 - 1.33 mg/dL Final   Comment: For patients >66 years of age, the reference limit for Creatinine is approximately 13% higher for people identified as African-American. .   . GFR, Est Non African American 08/10/2017 80  > OR = 60 mL/min/1.34m2  Final  . GFR, Est African American 08/10/2017 92  > OR = 60 mL/min/1.77m2 Final  . BUN/Creatinine Ratio 46/96/2952 NOT APPLICABLE  6 - 22 (calc) Final  . Sodium 08/10/2017 138  135 - 146 mmol/L Final  . Potassium 08/10/2017 4.7  3.5 - 5.3 mmol/L Final  . Chloride 08/10/2017 100  98 - 110 mmol/L Final  . CO2 08/10/2017 28  20 - 32 mmol/L Final  . Calcium 08/10/2017 9.9  8.6 - 10.3 mg/dL Final  . Total Protein 08/10/2017 6.9  6.1 - 8.1 g/dL Final  . Albumin 08/10/2017 4.2  3.6 - 5.1 g/dL Final  . Globulin 08/10/2017 2.7  1.9 - 3.7 g/dL (calc) Final  . AG Ratio 08/10/2017 1.6  1.0 - 2.5 (calc) Final  . Total Bilirubin 08/10/2017 0.8  0.2 - 1.2 mg/dL Final  . Alkaline phosphatase (APISO) 08/10/2017 78  40 - 115 U/L Final  . AST 08/10/2017 66* 10 - 35 U/L Final  . ALT 08/10/2017 79* 9 - 46 U/L Final  . WBC 08/10/2017 7.2  3.8 - 10.8 Thousand/uL Final  . RBC 08/10/2017 4.11* 4.20 - 5.80 Million/uL Final  . Hemoglobin 08/10/2017 13.6  13.2 - 17.1 g/dL Final   . HCT 08/10/2017 38.6  38.5 - 50.0 % Final  . MCV 08/10/2017 93.9  80.0 - 100.0 fL Final  . MCH 08/10/2017 33.1* 27.0 - 33.0 pg Final  . MCHC 08/10/2017 35.2  32.0 - 36.0 g/dL Final  . RDW 08/10/2017 13.5  11.0 - 15.0 % Final  . Platelets 08/10/2017 236  140 - 400 Thousand/uL Final  . MPV 08/10/2017 10.1  7.5 - 12.5 fL Final  . Neutro Abs 08/10/2017 4,118  1,500 - 7,800 cells/uL Final  . Lymphs Abs 08/10/2017 1,858  850 - 3,900 cells/uL Final  . WBC mixed population 08/10/2017 900  200 - 950 cells/uL Final  . Eosinophils Absolute 08/10/2017 252  15 - 500 cells/uL Final  . Basophils Absolute 08/10/2017 72  0 - 200 cells/uL Final  . Neutrophils Relative % 08/10/2017 57.2  % Final  . Total Lymphocyte 08/10/2017 25.8  % Final  . Monocytes Relative 08/10/2017 12.5  % Final  . Eosinophils Relative 08/10/2017 3.5  % Final  . Basophils Relative 08/10/2017 1.0  % Final  . Hgb A1c MFr Bld 08/10/2017 5.8* <5.7 % of total Hgb Final   Comment: For someone without known diabetes, a hemoglobin  A1c value between 5.7% and 6.4% is consistent with prediabetes and should be confirmed with a  follow-up test. . For someone with known diabetes, a value <7% indicates that their diabetes is well controlled. A1c targets should be individualized based on duration of diabetes, age, comorbid conditions, and other considerations. . This assay result is consistent with an increased risk of diabetes. . Currently, no consensus exists regarding use of hemoglobin A1c for diagnosis of diabetes for children. .   . Mean Plasma Glucose 08/10/2017 120  (calc) Final  . eAG (mmol/L) 08/10/2017 6.6  (calc) Final    Past Medical History:  Diagnosis Date  . Elevated LFTs    us-fatty infiltration (7/17)  . Hyperlipidemia   . Hypertension   . Prediabetes   . Seasonal allergies    Past Surgical History:  Procedure Laterality Date  . CLOSED REDUCTION FOREARM FRACTURE Left 1978  . KNEE ARTHROSCOPY Right 06/2010   . [place], 2002  . RETINAL DETACHMENT SURGERY  2011  . TONSILLECTOMY  1970  . VASECTOMY  1999   Current Outpatient Medications on File Prior to Visit  Medication Sig Dispense Refill  . amLODipine (NORVASC) 5 MG tablet TAKE 1 TABLET(5 MG) BY MOUTH DAILY 90 tablet 3  . aspirin 81 MG tablet Take 81 mg by mouth daily.    . metFORMIN (GLUCOPHAGE) 500 MG tablet TAKE 1 TABLET BY MOUTH TWICE DAILY WITH A MEAL 60 tablet 3  . Multiple Vitamins-Minerals (MULTIVITAMIN PO) Take by mouth.    . Omega-3 Fatty Acids (FISH OIL) 1200 MG CPDR Take by mouth.     No current facility-administered medications on file prior to visit.    No Known Allergies Social History   Socioeconomic History  . Marital status: Widowed    Spouse name: Not on file  . Number of children: Not on file  . Years of education: Not on file  . Highest education level: Not on file  Social Needs  . Financial resource strain: Not on file  . Food insecurity - worry: Not on file  . Food insecurity - inability: Not on file  . Transportation needs - medical: Not on file  . Transportation needs - non-medical: Not on file  Occupational History  . Not on file  Tobacco Use  . Smoking status: Never Smoker  . Smokeless tobacco: Never Used  Substance and Sexual Activity  . Alcohol use: Yes    Alcohol/week: 4.2 oz    Types: 7 Standard drinks or equivalent per week  . Drug use: No  . Sexual activity: Not on file  Other Topics Concern  . Not on file  Social History Narrative  . Not on file     Review of Systems  All other systems reviewed and are negative.      Objective:   Physical Exam  Constitutional: He appears well-developed and well-nourished.  Neck: Neck supple.  Cardiovascular: Normal rate, regular rhythm and normal heart sounds.  No murmur heard. Pulmonary/Chest: Effort normal and breath sounds normal. No respiratory distress. He has no wheezes. He has no rales.  Abdominal: Soft. Bowel sounds  are normal. He exhibits no distension. There is no tenderness. There is no rebound and no guarding.  Musculoskeletal: He exhibits no edema.  Lymphadenopathy:    He has no cervical adenopathy.  Vitals reviewed.         Assessment & Plan:  Diabetes mellitus type II, non insulin dependent (HCC)  Benign essential HTN  Elevated LFTs  Hyperlipidemia, unspecified hyperlipidemia type  I spent the majority of our visit today recommending complete and total abstinence from alcohol. I believe this coupled with fatty liver disease as the cause of his elevated liver function test. I have also recommended 30-40 pounds of weight loss by restricting calories to 1500 cal per day. I'm happy with his blood pressure. I'm extremely happy with his hemoglobin A1c. Patient will continue to work on lifestyle changes to address his underlying risk factors. Recheck in 6 months

## 2017-10-13 ENCOUNTER — Encounter: Payer: Self-pay | Admitting: Gastroenterology

## 2017-11-24 ENCOUNTER — Other Ambulatory Visit: Payer: Self-pay | Admitting: Family Medicine

## 2018-02-12 ENCOUNTER — Ambulatory Visit: Payer: No Typology Code available for payment source | Admitting: Family Medicine

## 2018-02-13 ENCOUNTER — Other Ambulatory Visit: Payer: No Typology Code available for payment source

## 2018-02-13 DIAGNOSIS — I10 Essential (primary) hypertension: Secondary | ICD-10-CM

## 2018-02-13 DIAGNOSIS — E119 Type 2 diabetes mellitus without complications: Secondary | ICD-10-CM

## 2018-02-13 DIAGNOSIS — E785 Hyperlipidemia, unspecified: Secondary | ICD-10-CM

## 2018-02-14 LAB — COMPREHENSIVE METABOLIC PANEL
AG RATIO: 1.7 (calc) (ref 1.0–2.5)
ALKALINE PHOSPHATASE (APISO): 80 U/L (ref 40–115)
ALT: 42 U/L (ref 9–46)
AST: 25 U/L (ref 10–35)
Albumin: 4.3 g/dL (ref 3.6–5.1)
BILIRUBIN TOTAL: 0.4 mg/dL (ref 0.2–1.2)
BUN: 15 mg/dL (ref 7–25)
CALCIUM: 9.9 mg/dL (ref 8.6–10.3)
CHLORIDE: 103 mmol/L (ref 98–110)
CO2: 27 mmol/L (ref 20–32)
CREATININE: 1.11 mg/dL (ref 0.70–1.33)
GLOBULIN: 2.6 g/dL (ref 1.9–3.7)
Glucose, Bld: 132 mg/dL — ABNORMAL HIGH (ref 65–99)
POTASSIUM: 4.7 mmol/L (ref 3.5–5.3)
Sodium: 139 mmol/L (ref 135–146)
Total Protein: 6.9 g/dL (ref 6.1–8.1)

## 2018-02-14 LAB — HEMOGLOBIN A1C
EAG (MMOL/L): 6 (calc)
Hgb A1c MFr Bld: 5.4 % of total Hgb (ref <5.7)
MEAN PLASMA GLUCOSE: 108 (calc)

## 2018-02-14 LAB — LIPID PANEL
CHOLESTEROL: 159 mg/dL (ref <200)
HDL: 48 mg/dL (ref >40)
LDL Cholesterol (Calc): 90 mg/dL (calc)
Non-HDL Cholesterol (Calc): 111 mg/dL (calc) (ref <130)
Total CHOL/HDL Ratio: 3.3 (calc) (ref <5.0)
Triglycerides: 117 mg/dL (ref <150)

## 2018-02-14 LAB — CBC WITH DIFFERENTIAL/PLATELET
BASOS PCT: 1.1 %
Basophils Absolute: 89 cells/uL (ref 0–200)
EOS ABS: 324 {cells}/uL (ref 15–500)
Eosinophils Relative: 4 %
HEMATOCRIT: 42.5 % (ref 38.5–50.0)
HEMOGLOBIN: 14.9 g/dL (ref 13.2–17.1)
LYMPHS ABS: 1693 {cells}/uL (ref 850–3900)
MCH: 32.3 pg (ref 27.0–33.0)
MCHC: 35.1 g/dL (ref 32.0–36.0)
MCV: 92.2 fL (ref 80.0–100.0)
MPV: 9.9 fL (ref 7.5–12.5)
Monocytes Relative: 12.9 %
Neutro Abs: 4949 cells/uL (ref 1500–7800)
Neutrophils Relative %: 61.1 %
Platelets: 287 10*3/uL (ref 140–400)
RBC: 4.61 10*6/uL (ref 4.20–5.80)
RDW: 13 % (ref 11.0–15.0)
Total Lymphocyte: 20.9 %
WBC: 8.1 10*3/uL (ref 3.8–10.8)
WBCMIX: 1045 {cells}/uL — AB (ref 200–950)

## 2018-02-18 ENCOUNTER — Encounter: Payer: Self-pay | Admitting: Family Medicine

## 2018-02-18 ENCOUNTER — Ambulatory Visit: Payer: No Typology Code available for payment source | Admitting: Family Medicine

## 2018-02-18 VITALS — BP 144/90 | HR 84 | Temp 98.1°F | Resp 16 | Ht 69.0 in | Wt 228.0 lb

## 2018-02-18 DIAGNOSIS — I10 Essential (primary) hypertension: Secondary | ICD-10-CM | POA: Diagnosis not present

## 2018-02-18 DIAGNOSIS — R945 Abnormal results of liver function studies: Secondary | ICD-10-CM | POA: Diagnosis not present

## 2018-02-18 DIAGNOSIS — R7989 Other specified abnormal findings of blood chemistry: Secondary | ICD-10-CM

## 2018-02-18 DIAGNOSIS — E119 Type 2 diabetes mellitus without complications: Secondary | ICD-10-CM | POA: Diagnosis not present

## 2018-02-18 NOTE — Progress Notes (Signed)
Subjective:    Patient ID: Leonard Garrett, male    DOB: 02-24-1966, 52 y.o.   MRN: [id number]  Medication Refill     11/20/16 Patient returns today for follow-up. His lab work shows an improvement in his hemoglobin A1c. His cholesterol is excellent. Unfortunately his liver function test or worsening. Patient today admits that he is an alcoholic. Apparently, over the weekend, the patient's family [name] intervention and stated that he needed to get help. He admits that he is drinking entirely too much. He is already scheduled an appointment this afternoon on his own to meet with a substance abuse counselor with an alcohol treatment program. He acknowledges that this is the reason his liver function test are likely elevated. His blood pressure today is also elevated. However he is very anxious. He states that he is extremely nervous admitting that he is an alcoholic. He has been in denial for quite some time. He believes that his interaction with his family and the process of admitting that he has a problem is causing his blood pressure to be high. He states that he normally checks his blood pressure at home and finds it 591 to 6:38 systolic. He believes that today is a false positive.  At that time, my plan was: Diabetes is adequately controlled and cholesterol is acceptable. I believe the patient's liver function tests are secondary to his alcohol abuse. Patient is entering an alcohol treatment program and would like to be abstinent from alcohol for 3 months and then recheck his labs prior to any invasive testing. I believe this is acceptable. His blood pressure however concerns me. He refuses any antihypertensive medication at the present time other than what he is already taking. He believes this is due to anxiety. He would like to check his blood pressure everyday for the next few weeks and report the values to me in 2 weeks.    08/14/17 Patient admits that he started drinking again. As a result his  liver function tests have increased. He is going to addiction specialist and also a counseling group to help with his alcohol abuse. However he is drinking. His blood pressure today is outstanding at 132/80. His hemoglobin A1c is much better at 5.8. His LDL cholesterol is acceptable at 100 and the patient refuses to take a statin. I would be hesitant to use a statin given his alcohol use.  At that time, my plan was: I spent the majority of our visit today recommending complete and total abstinence from alcohol. I believe this coupled with fatty liver disease as the cause of his elevated liver function test. I have also recommended 30-40 pounds of weight loss by restricting calories to 1500 cal per day. I'm happy with his blood pressure. I'm extremely happy with his hemoglobin A1c. Patient will continue to work on lifestyle changes to address his underlying risk factors. Recheck in 6 months  02/18/18 I am extremely proud of this patient.  He is attending [place] meetings.  He is trying to abstain from alcohol as best he can.  He is also drastically changed his diet and is exercising and losing weight.  Since his last visit in January he has lost 11 pounds and he is challenged himself to try to lose 30.  As a result, his hemoglobin A1c has fallen from 5.8-5.4.  Also his elevated liver function test have normalized.  I am extremely excited about this.  His [place] deficiency and management would be the addition of a statin  to his regimen given his history of diabetes.  Patient is very hesitant to do this.  He is due this year for his colonoscopy however he elects to defer it at the present time due to financial reasons.  He does get regular eye exams every 6 months.  He states his last eye exam was earlier this year.  He will have his ophthalmologist send [place] a copy at his next visit. Lab on 02/13/2018  Component Date Value Ref Range Status  . Cholesterol 02/13/2018 159  <200 mg/dL Final  . HDL 02/13/2018 48  >40 mg/dL  Final  . Triglycerides 02/13/2018 117  <150 mg/dL Final  . LDL Cholesterol (Calc) 02/13/2018 90  mg/dL (calc) Final   Comment: Reference range: <100 . Desirable range <100 mg/dL for primary prevention;   <70 mg/dL for patients with CHD or diabetic patients  with > or = 2 CHD risk factors. Marland Kitchen LDL-C is now calculated using the Martin-Hopkins  calculation, which is a validated novel method providing  better accuracy than the Friedewald equation in the  estimation of LDL-C.  Cresenciano Genre et al. Annamaria Helling. 3646;803(21): 2061-2068  (http://education.[facility].com/faq/FAQ164)   . Total CHOL/HDL Ratio 02/13/2018 3.3  <5.0 (calc) Final  . Non-HDL Cholesterol (Calc) 02/13/2018 111  <130 mg/dL (calc) Final   Comment: For patients with diabetes plus 1 major ASCVD risk  factor, treating to a non-HDL-C goal of <100 mg/dL  (LDL-C of <70 mg/dL) is considered a therapeutic  option.   . WBC 02/13/2018 8.1  3.8 - 10.8 Thousand/uL Final  . RBC 02/13/2018 4.61  4.20 - 5.80 Million/uL Final  . Hemoglobin 02/13/2018 14.9  13.2 - 17.1 g/dL Final  . HCT 02/13/2018 42.5  38.5 - 50.0 % Final  . MCV 02/13/2018 92.2  80.0 - 100.0 fL Final  . MCH 02/13/2018 32.3  27.0 - 33.0 pg Final  . MCHC 02/13/2018 35.1  32.0 - 36.0 g/dL Final  . RDW 02/13/2018 13.0  11.0 - 15.0 % Final  . Platelets 02/13/2018 287  140 - 400 Thousand/uL Final  . MPV 02/13/2018 9.9  7.5 - 12.5 fL Final  . Neutro Abs 02/13/2018 4,949  1,500 - 7,800 cells/uL Final  . Lymphs Abs 02/13/2018 1,693  850 - 3,900 cells/uL Final  . WBC mixed population 02/13/2018 1,045* 200 - 950 cells/uL Final  . Eosinophils Absolute 02/13/2018 324  15 - 500 cells/uL Final  . Basophils Absolute 02/13/2018 89  0 - 200 cells/uL Final  . Neutrophils Relative % 02/13/2018 61.1  % Final  . Total Lymphocyte 02/13/2018 20.9  % Final  . Monocytes Relative 02/13/2018 12.9  % Final  . Eosinophils Relative 02/13/2018 4.0  % Final  . Basophils Relative 02/13/2018 1.1  %  Final  . Glucose, Bld 02/13/2018 132* 65 - 99 mg/dL Final   Comment: .            Fasting reference interval . For someone without known diabetes, a glucose value >125 mg/dL indicates that they may have diabetes and this should be confirmed with a follow-up test. .   . BUN 02/13/2018 15  7 - 25 mg/dL Final  . Creat 02/13/2018 1.11  0.70 - 1.33 mg/dL Final   Comment: For patients >35 years of age, the reference limit for Creatinine is approximately 13% higher for people identified as African-American. .   Havery Moros Ratio 22/48/2500 NOT APPLICABLE  6 - 22 (calc) Final  . Sodium 02/13/2018 139  135 - 146 mmol/L Final  .  Potassium 02/13/2018 4.7  3.5 - 5.3 mmol/L Final  . Chloride 02/13/2018 103  98 - 110 mmol/L Final  . CO2 02/13/2018 27  20 - 32 mmol/L Final  . Calcium 02/13/2018 9.9  8.6 - 10.3 mg/dL Final  . Total Protein 02/13/2018 6.9  6.1 - 8.1 g/dL Final  . Albumin 02/13/2018 4.3  3.6 - 5.1 g/dL Final  . Globulin 02/13/2018 2.6  1.9 - 3.7 g/dL (calc) Final  . AG Ratio 02/13/2018 1.7  1.0 - 2.5 (calc) Final  . Total Bilirubin 02/13/2018 0.4  0.2 - 1.2 mg/dL Final  . Alkaline phosphatase (APISO) 02/13/2018 80  40 - 115 U/L Final  . AST 02/13/2018 25  10 - 35 U/L Final  . ALT 02/13/2018 42  9 - 46 U/L Final  . Hgb A1c MFr Bld 02/13/2018 5.4  <5.7 % of total Hgb Final   Comment: For the purpose of screening for the presence of diabetes: . <5.7%       Consistent with the absence of diabetes 5.7-6.4%    Consistent with increased risk for diabetes             (prediabetes) > or =6.5%  Consistent with diabetes . This assay result is consistent with a decreased risk of diabetes. . Currently, no consensus exists regarding use of hemoglobin A1c for diagnosis of diabetes in children. . According to American Diabetes Association (ADA) guidelines, hemoglobin A1c <7.0% represents optimal control in non-pregnant diabetic patients. Different metrics may apply to specific  patient populations.  Standards of Medical Care in Diabetes(ADA). .   . Mean Plasma Glucose 02/13/2018 108  (calc) Final  . eAG (mmol/L) 02/13/2018 6.0  (calc) Final    Past Medical History:  Diagnosis Date  . Elevated LFTs    us-fatty infiltration (7/17)  . Hyperlipidemia   . Hypertension   . Prediabetes   . Seasonal allergies    Past Surgical History:  Procedure Laterality Date  . CLOSED REDUCTION FOREARM FRACTURE Left 1978  . KNEE ARTHROSCOPY Right 06/2010  . [place], 2002  . RETINAL DETACHMENT SURGERY  2011  . TONSILLECTOMY  1970  . VASECTOMY  1999   Current Outpatient Medications on File Prior to Visit  Medication Sig Dispense Refill  . amLODipine (NORVASC) 5 MG tablet TAKE 1 TABLET(5 MG) BY MOUTH DAILY 90 tablet 1  . aspirin 81 MG tablet Take 81 mg by mouth daily.    . irbesartan (AVAPRO) 300 MG tablet Take 1 tablet (300 mg total) by mouth daily. 90 tablet 3  . metFORMIN (GLUCOPHAGE) 500 MG tablet TAKE 1 TABLET BY MOUTH TWICE DAILY WITH A MEAL 60 tablet 3  . Multiple Vitamins-Minerals (MULTIVITAMIN PO) Take by mouth.    . Omega-3 Fatty Acids (FISH OIL) 1200 MG CPDR Take by mouth.     No current facility-administered medications on file prior to visit.    No Known Allergies Social History   Socioeconomic History  . Marital status: Widowed    Spouse name: Not on file  . Number of children: Not on file  . Years of education: Not on file  . Highest education level: Not on file  Occupational History  . Not on file  Social Needs  . Financial resource strain: Not on file  . Food insecurity:    Worry: Not on file    Inability: Not on file  . Transportation needs:    Medical: Not on file    Non-medical: Not on file  Tobacco Use  . Smoking status: Never Smoker  . Smokeless tobacco: Never Used  Substance and Sexual Activity  . Alcohol use: Yes    Alcohol/week: 4.2 oz    Types: 7 Standard drinks or equivalent per week  . Drug use: No  .  Sexual activity: Not on file  Lifestyle  . Physical activity:    Days per week: Not on file    Minutes per session: Not on file  . Stress: Not on file  Relationships  . Social connections:    Talks on phone: Not on file    Gets together: Not on file    Attends religious service: Not on file    Active member of club or organization: Not on file    Attends meetings of clubs or organizations: Not on file    Relationship status: Not on file  . Intimate partner violence:    Fear of current or ex partner: Not on file    Emotionally abused: Not on file    Physically abused: Not on file    Forced sexual activity: Not on file  Other Topics Concern  . Not on file  Social History Narrative  . Not on file     Review of Systems  All other systems reviewed and are negative.      Objective:   Physical Exam  Constitutional: He appears well-developed and well-nourished.  Neck: Neck supple.  Cardiovascular: Normal rate, regular rhythm and normal heart sounds.  No murmur heard. Pulmonary/Chest: Effort normal and breath sounds normal. No respiratory distress. He has no wheezes. He has no rales.  Abdominal: Soft. Bowel sounds are normal. He exhibits no distension. There is no tenderness. There is no rebound and no guarding.  Musculoskeletal: He exhibits no edema.  Lymphadenopathy:    He has no cervical adenopathy.  Vitals reviewed.         Assessment & Plan:  Diabetes mellitus type II, non insulin dependent (HCC)  Benign essential HTN  Elevated LFTs  Lab work is excellent.  Blood pressure is elevated today however the patient has whitecoat syndrome.  He states that he will check his blood pressure at home over the next week and email me those values to confirm that it is in fact better managed at home.  A1c is excellent.  I suggested stopping metformin however he elects to stay on the medication for the present time.  Diabetic eye exam and foot exam are up-to-date.  I recommended a  low intensity statin given his history of diabetes but the patient declines.  He also defers his colonoscopy at the present time due to cost.  Otherwise he is doing well and I congratulated him on his lifestyle changes that have paid large dividends in his lab work.

## 2018-03-26 ENCOUNTER — Other Ambulatory Visit: Payer: Self-pay | Admitting: Family Medicine

## 2018-05-13 LAB — HM DIABETES EYE EXAM

## 2018-05-17 ENCOUNTER — Other Ambulatory Visit: Payer: Self-pay | Admitting: Family Medicine

## 2018-05-20 ENCOUNTER — Encounter: Payer: Self-pay | Admitting: *Deleted

## 2018-07-13 ENCOUNTER — Other Ambulatory Visit: Payer: Self-pay | Admitting: Family Medicine

## 2018-08-09 ENCOUNTER — Other Ambulatory Visit: Payer: Self-pay | Admitting: Family Medicine

## 2018-08-13 ENCOUNTER — Other Ambulatory Visit: Payer: Self-pay | Admitting: Family Medicine

## 2018-09-12 ENCOUNTER — Encounter: Payer: Self-pay | Admitting: Family Medicine

## 2018-10-06 ENCOUNTER — Other Ambulatory Visit: Payer: Self-pay | Admitting: Family Medicine

## 2018-11-26 ENCOUNTER — Other Ambulatory Visit: Payer: Self-pay | Admitting: Family Medicine

## 2019-01-25 ENCOUNTER — Other Ambulatory Visit: Payer: Self-pay | Admitting: Family Medicine

## 2019-02-12 LAB — HM DIABETES EYE EXAM

## 2019-02-24 ENCOUNTER — Other Ambulatory Visit: Payer: Self-pay | Admitting: Family Medicine

## 2019-03-26 ENCOUNTER — Other Ambulatory Visit: Payer: Self-pay | Admitting: Family Medicine

## 2019-04-25 ENCOUNTER — Other Ambulatory Visit: Payer: Self-pay | Admitting: Family Medicine

## 2019-05-25 ENCOUNTER — Other Ambulatory Visit: Payer: Self-pay | Admitting: Family Medicine

## 2019-06-24 ENCOUNTER — Other Ambulatory Visit: Payer: Self-pay | Admitting: Family Medicine

## 2019-07-11 ENCOUNTER — Other Ambulatory Visit: Payer: Self-pay

## 2019-07-11 ENCOUNTER — Other Ambulatory Visit: Payer: No Typology Code available for payment source

## 2019-07-11 ENCOUNTER — Other Ambulatory Visit: Payer: Self-pay | Admitting: Family Medicine

## 2019-07-11 DIAGNOSIS — I10 Essential (primary) hypertension: Secondary | ICD-10-CM

## 2019-07-11 DIAGNOSIS — E119 Type 2 diabetes mellitus without complications: Secondary | ICD-10-CM

## 2019-07-11 DIAGNOSIS — E785 Hyperlipidemia, unspecified: Secondary | ICD-10-CM

## 2019-07-12 LAB — COMPREHENSIVE METABOLIC PANEL
AG Ratio: 1.6 (calc) (ref 1.0–2.5)
ALT: 104 U/L — ABNORMAL HIGH (ref 9–46)
AST: 75 U/L — ABNORMAL HIGH (ref 10–35)
Albumin: 4.5 g/dL (ref 3.6–5.1)
Alkaline phosphatase (APISO): 83 U/L (ref 35–144)
BUN: 13 mg/dL (ref 7–25)
CO2: 26 mmol/L (ref 20–32)
Calcium: 10.2 mg/dL (ref 8.6–10.3)
Chloride: 102 mmol/L (ref 98–110)
Creat: 1.09 mg/dL (ref 0.70–1.33)
Globulin: 2.8 g/dL (calc) (ref 1.9–3.7)
Glucose, Bld: 124 mg/dL — ABNORMAL HIGH (ref 65–99)
Potassium: 4.8 mmol/L (ref 3.5–5.3)
Sodium: 139 mmol/L (ref 135–146)
Total Bilirubin: 0.6 mg/dL (ref 0.2–1.2)
Total Protein: 7.3 g/dL (ref 6.1–8.1)

## 2019-07-12 LAB — CBC WITH DIFFERENTIAL/PLATELET
Absolute Monocytes: 888 cells/uL (ref 200–950)
Basophils Absolute: 72 cells/uL (ref 0–200)
Basophils Relative: 0.9 %
Eosinophils Absolute: 192 cells/uL (ref 15–500)
Eosinophils Relative: 2.4 %
HCT: 44.1 % (ref 38.5–50.0)
Hemoglobin: 15.2 g/dL (ref 13.2–17.1)
Lymphs Abs: 1832 cells/uL (ref 850–3900)
MCH: 33.2 pg — ABNORMAL HIGH (ref 27.0–33.0)
MCHC: 34.5 g/dL (ref 32.0–36.0)
MCV: 96.3 fL (ref 80.0–100.0)
MPV: 10.2 fL (ref 7.5–12.5)
Monocytes Relative: 11.1 %
Neutro Abs: 5016 cells/uL (ref 1500–7800)
Neutrophils Relative %: 62.7 %
Platelets: 271 10*3/uL (ref 140–400)
RBC: 4.58 10*6/uL (ref 4.20–5.80)
RDW: 11.8 % (ref 11.0–15.0)
Total Lymphocyte: 22.9 %
WBC: 8 10*3/uL (ref 3.8–10.8)

## 2019-07-12 LAB — HEMOGLOBIN A1C
Hgb A1c MFr Bld: 5.8 % of total Hgb — ABNORMAL HIGH (ref <5.7)
Mean Plasma Glucose: 120 (calc)
eAG (mmol/L): 6.6 (calc)

## 2019-07-12 LAB — LIPID PANEL
Cholesterol: 190 mg/dL (ref <200)
HDL: 50 mg/dL (ref >=40)
LDL Cholesterol (Calc): 109 mg/dL (calc) — ABNORMAL HIGH
Non-HDL Cholesterol (Calc): 140 mg/dL (calc) — ABNORMAL HIGH (ref <130)
Total CHOL/HDL Ratio: 3.8 (calc) (ref <5.0)
Triglycerides: 191 mg/dL — ABNORMAL HIGH (ref <150)

## 2019-07-14 ENCOUNTER — Ambulatory Visit: Payer: No Typology Code available for payment source | Admitting: Family Medicine

## 2019-07-14 ENCOUNTER — Encounter: Payer: Self-pay | Admitting: Family Medicine

## 2019-07-14 ENCOUNTER — Other Ambulatory Visit: Payer: Self-pay

## 2019-07-14 VITALS — BP 172/110 | HR 96 | Temp 97.3°F | Resp 16 | Ht 69.0 in | Wt 242.0 lb

## 2019-07-14 DIAGNOSIS — R7303 Prediabetes: Secondary | ICD-10-CM | POA: Diagnosis not present

## 2019-07-14 DIAGNOSIS — I10 Essential (primary) hypertension: Secondary | ICD-10-CM | POA: Diagnosis not present

## 2019-07-14 DIAGNOSIS — K760 Fatty (change of) liver, not elsewhere classified: Secondary | ICD-10-CM | POA: Diagnosis not present

## 2019-07-14 DIAGNOSIS — E785 Hyperlipidemia, unspecified: Secondary | ICD-10-CM | POA: Diagnosis not present

## 2019-07-14 NOTE — Progress Notes (Signed)
Subjective:    Patient ID: Leonard Garrett, male    DOB: 02-09-1966, 53 y.o.   MRN: [id number]  Patient is here today for follow-up of his chronic medical conditions.  His blood pressure is elevated 172/110 however he has whitecoat syndrome.  He has been checking his blood pressure daily at home and recording the values for me.  His blood pressure has been between 120-125/68-76.  His heart rate is typically in the mid 60s.  Therefore his blood pressure is well controlled.  However since I last saw the patient, he has gained weight.  At his last visit he was 228 pounds.  His liver function test had normalized.  His LDL cholesterol was well below 100.  His hemoglobin A1c was down to 5.4.  Since I saw him, he has started a sedentary job.  With the COVID-19 pandemic he is not exercising regularly.  His weight has increased 14 pounds to 242 pounds.  As result his liver function tests are now higher than they have ever been.  He also admits to drinking alcohol regularly.  His A1c has risen from 5.4-5.8 and his LDL cholesterol is now up to 109.  We discussed this at length.  He had a ultrasound in 2017 which confirmed fatty liver disease. Appointment on 07/11/2019  Component Date Value Ref Range Status  . WBC 07/11/2019 8.0  3.8 - 10.8 Thousand/uL Final  . RBC 07/11/2019 4.58  4.20 - 5.80 Million/uL Final  . Hemoglobin 07/11/2019 15.2  13.2 - 17.1 g/dL Final  . HCT 07/11/2019 44.1  38.5 - 50.0 % Final  . MCV 07/11/2019 96.3  80.0 - 100.0 fL Final  . MCH 07/11/2019 33.2* 27.0 - 33.0 pg Final  . MCHC 07/11/2019 34.5  32.0 - 36.0 g/dL Final  . RDW 07/11/2019 11.8  11.0 - 15.0 % Final  . Platelets 07/11/2019 271  140 - 400 Thousand/uL Final  . MPV 07/11/2019 10.2  7.5 - 12.5 fL Final  . Neutro Abs 07/11/2019 5,016  1,500 - 7,800 cells/uL Final  . Lymphs Abs 07/11/2019 1,832  850 - 3,900 cells/uL Final  . Absolute Monocytes 07/11/2019 888  200 - 950 cells/uL Final  . Eosinophils Absolute 07/11/2019 192  15 -  500 cells/uL Final  . Basophils Absolute 07/11/2019 72  0 - 200 cells/uL Final  . Neutrophils Relative % 07/11/2019 62.7  % Final  . Total Lymphocyte 07/11/2019 22.9  % Final  . Monocytes Relative 07/11/2019 11.1  % Final  . Eosinophils Relative 07/11/2019 2.4  % Final  . Basophils Relative 07/11/2019 0.9  % Final  . Glucose, Bld 07/11/2019 124* 65 - 99 mg/dL Final   Comment: .            Fasting reference interval . For someone without known diabetes, a glucose value between 100 and 125 mg/dL is consistent with prediabetes and should be confirmed with a follow-up test. .   . BUN 07/11/2019 13  7 - 25 mg/dL Final  . Creat 07/11/2019 1.09  0.70 - 1.33 mg/dL Final   Comment: For patients >24 years of age, the reference limit for Creatinine is approximately 13% higher for people identified as African-American. .   Havery Moros Ratio [id number] NOT APPLICABLE  6 - 22 (calc) Final  . Sodium 07/11/2019 139  135 - 146 mmol/L Final  . Potassium 07/11/2019 4.8  3.5 - 5.3 mmol/L Final  . Chloride 07/11/2019 102  98 - 110 mmol/L Final  . CO2 07/11/2019  26  20 - 32 mmol/L Final  . Calcium 07/11/2019 10.2  8.6 - 10.3 mg/dL Final  . Total Protein 07/11/2019 7.3  6.1 - 8.1 g/dL Final  . Albumin 07/11/2019 4.5  3.6 - 5.1 g/dL Final  . Globulin 07/11/2019 2.8  1.9 - 3.7 g/dL (calc) Final  . AG Ratio 07/11/2019 1.6  1.0 - 2.5 (calc) Final  . Total Bilirubin 07/11/2019 0.6  0.2 - 1.2 mg/dL Final  . Alkaline phosphatase (APISO) 07/11/2019 83  35 - 144 U/L Final  . AST 07/11/2019 75* 10 - 35 U/L Final  . ALT 07/11/2019 104* 9 - 46 U/L Final  . Cholesterol 07/11/2019 190  <200 mg/dL Final  . HDL 07/11/2019 50  > OR = 40 mg/dL Final  . Triglycerides 07/11/2019 191* <150 mg/dL Final  . LDL Cholesterol (Calc) 07/11/2019 109* mg/dL (calc) Final   Comment: Reference range: <100 . Desirable range <100 mg/dL for primary prevention;   <70 mg/dL for patients with CHD or diabetic patients  with >  or = 2 CHD risk factors. Marland Kitchen LDL-C is now calculated using the Martin-Hopkins  calculation, which is a validated novel method providing  better accuracy than the Friedewald equation in the  estimation of LDL-C.  Cresenciano Genre et al. Annamaria Helling. [id number]): 2061-2068  (http://education.[facility].com/faq/FAQ164)   . Total CHOL/HDL Ratio 07/11/2019 3.8  <5.0 (calc) Final  . Non-HDL Cholesterol (Calc) 07/11/2019 140* <130 mg/dL (calc) Final   Comment: For patients with diabetes plus 1 major ASCVD risk  factor, treating to a non-HDL-C goal of <100 mg/dL  (LDL-C of <70 mg/dL) is considered a therapeutic  option.   . Hgb A1c MFr Bld 07/11/2019 5.8* <5.7 % of total Hgb Final   Comment: For someone without known diabetes, a hemoglobin  A1c value between 5.7% and 6.4% is consistent with prediabetes and should be confirmed with a  follow-up test. . For someone with known diabetes, a value <7% indicates that their diabetes is well controlled. A1c targets should be individualized based on duration of diabetes, age, comorbid conditions, and other considerations. . This assay result is consistent with an increased risk of diabetes. . Currently, no consensus exists regarding use of hemoglobin A1c for diagnosis of diabetes for children. .   . Mean Plasma Glucose 07/11/2019 120  (calc) Final  . eAG (mmol/L) 07/11/2019 6.6  (calc) Final    Past Medical History:  Diagnosis Date  . Elevated LFTs    us-fatty infiltration (7/17)  . Hyperlipidemia   . Hypertension   . Prediabetes   . Seasonal allergies    Past Surgical History:  Procedure Laterality Date  . CLOSED REDUCTION FOREARM FRACTURE Left 1978  . KNEE ARTHROSCOPY Right 06/2010  . [place], 2002  . RETINAL DETACHMENT SURGERY  2011  . TONSILLECTOMY  1970  . VASECTOMY  1999   Current Outpatient Medications on File Prior to Visit  Medication Sig Dispense Refill  . amLODipine (NORVASC) 5 MG tablet TAKE 1 TABLET(5 MG)  BY MOUTH DAILY 90 tablet 0  . aspirin 81 MG tablet Take 81 mg by mouth daily.    . irbesartan (AVAPRO) 150 MG tablet TAKE 2 TABLETS BY MOUTH EVERY NIGHT AT BEDTIME 60 tablet 11  . metFORMIN (GLUCOPHAGE) 500 MG tablet TAKE 1 TABLET(500 MG) BY MOUTH TWICE DAILY WITH A MEAL 60 tablet 0  . Multiple Vitamins-Minerals (MULTIVITAMIN PO) Take by mouth.    . Omega-3 Fatty Acids (FISH OIL) 1200 MG CPDR Take by mouth.  No current facility-administered medications on file prior to visit.    No Known Allergies Social History   Socioeconomic History  . Marital status: Widowed    Spouse name: Not on file  . Number of children: Not on file  . Years of education: Not on file  . Highest education level: Not on file  Occupational History  . Not on file  Social Needs  . Financial resource strain: Not on file  . Food insecurity    Worry: Not on file    Inability: Not on file  . Transportation needs    Medical: Not on file    Non-medical: Not on file  Tobacco Use  . Smoking status: Never Smoker  . Smokeless tobacco: Never Used  Substance and Sexual Activity  . Alcohol use: Yes    Alcohol/week: 7.0 standard drinks    Types: 7 Standard drinks or equivalent per week  . Drug use: No  . Sexual activity: Not on file  Lifestyle  . Physical activity    Days per week: Not on file    Minutes per session: Not on file  . Stress: Not on file  Relationships  . Social Herbalist on phone: Not on file    Gets together: Not on file    Attends religious service: Not on file    Active member of club or organization: Not on file    Attends meetings of clubs or organizations: Not on file    Relationship status: Not on file  . Intimate partner violence    Fear of current or ex partner: Not on file    Emotionally abused: Not on file    Physically abused: Not on file    Forced sexual activity: Not on file  Other Topics Concern  . Not on file  Social History Narrative  . Not on file      Review of Systems  All other systems reviewed and are negative.      Objective:   Physical Exam  Constitutional: He appears well-developed and well-nourished.  Neck: Neck supple.  Cardiovascular: Normal rate, regular rhythm and normal heart sounds.  No murmur heard. Pulmonary/Chest: Effort normal and breath sounds normal. No respiratory distress. He has no wheezes. He has no rales.  Abdominal: Soft. Bowel sounds are normal. He exhibits no distension. There is no abdominal tenderness. There is no rebound and no guarding.  Musculoskeletal:        General: No edema.  Lymphadenopathy:    He has no cervical adenopathy.  Vitals reviewed.         Assessment & Plan:  Essential hypertension  Hyperlipidemia, unspecified hyperlipidemia type  Prediabetes  Fatty liver disease, nonalcoholic  Blood pressure at home is well controlled.  Liver function test are elevated.  I have recommended trying to lose 15 pounds through 30 minutes a day of aerobic exercise and a low-fat diet.  I believe if he can lose the 15 pounds, his lab work will normalize again and his LDL cholesterol fall below 100 and his liver function test will be back to normal.  Also recommended abstinence from alcohol.  Spent approximately 15 minutes with the patient discussing this.  He also has some numbness on the anterolateral aspect of both thighs in the distribution of the lateral femoral cutaneous nerve.  I believe a lot of this is due to his belt compressing the nerve when he is sitting.  I believe that weight loss to reduce his abdominal  pannus will help remove some of the pressure off the lateral femoral cutaneous nerve.  However if symptoms worsen, I would recommend nerve conduction studies.

## 2019-07-24 ENCOUNTER — Other Ambulatory Visit: Payer: Self-pay | Admitting: Family Medicine

## 2019-09-15 ENCOUNTER — Other Ambulatory Visit: Payer: Self-pay | Admitting: Family Medicine

## 2019-09-26 ENCOUNTER — Encounter: Payer: Self-pay | Admitting: Family Medicine

## 2019-11-19 LAB — HM DIABETES EYE EXAM

## 2019-11-28 ENCOUNTER — Encounter: Payer: Self-pay | Admitting: *Deleted

## 2020-01-20 ENCOUNTER — Other Ambulatory Visit: Payer: Self-pay | Admitting: Family Medicine

## 2020-07-18 ENCOUNTER — Other Ambulatory Visit: Payer: Self-pay | Admitting: Family Medicine

## 2020-08-10 ENCOUNTER — Other Ambulatory Visit: Payer: Self-pay | Admitting: Family Medicine

## 2021-01-14 ENCOUNTER — Other Ambulatory Visit: Payer: Self-pay | Admitting: Family Medicine

## 2021-02-14 ENCOUNTER — Other Ambulatory Visit: Payer: Self-pay | Admitting: Urology

## 2021-02-15 ENCOUNTER — Encounter (HOSPITAL_BASED_OUTPATIENT_CLINIC_OR_DEPARTMENT_OTHER): Payer: Self-pay | Admitting: Urology

## 2021-02-15 ENCOUNTER — Other Ambulatory Visit: Payer: Self-pay

## 2021-02-15 NOTE — Progress Notes (Signed)
Spoke w/ via phone for pre-op interview--- pT Lab needs dos----  Istat and EKG             Lab results------ no COVID test -----patient states asymptomatic no test needed Arrive at ------- 1115 on 02-18-2021 NPO after MN NO Solid Food.  Clear liquids from MN until--- 1015 Med rec completed Medications to take morning of surgery ----- Norvasc Diabetic medication ----- n/a Patient instructed no nail polish to be worn day of surgery Patient instructed to bring photo id and insurance card day of surgery Patient aware to have Driver (ride ) / caregiver for 24 hours after surgery --girlfriend, Reather Converse Patient Special Instructions ----- n/a Pre-Op special Istructions ----- n/a Patient verbalized understanding of instructions that were given at this phone interview. Patient denies shortness of breath, chest pain, fever, cough at this phone interview.

## 2021-02-17 NOTE — Anesthesia Preprocedure Evaluation (Addendum)
Anesthesia Evaluation  Patient identified by MRN, date of birth, ID band Patient awake    Reviewed: Allergy & Precautions, NPO status , Patient's Chart, lab work & pertinent test results  History of Anesthesia Complications Negative for: history of anesthetic complications  Airway Mallampati: II  TM Distance: >3 FB Neck ROM: Full    Dental no notable dental hx.    Pulmonary neg pulmonary ROS,    Pulmonary exam normal        Cardiovascular hypertension, Pt. on medications Normal cardiovascular exam     Neuro/Psych negative neurological ROS  negative psych ROS   GI/Hepatic negative GI ROS, Neg liver ROS,   Endo/Other  diabetes, Type 2, Oral Hypoglycemic AgentsBMI 34  Renal/GU negative Renal ROS   Left testicular mass    Musculoskeletal negative musculoskeletal ROS (+)   Abdominal   Peds  Hematology negative hematology ROS (+)   Anesthesia Other Findings Day of surgery medications reviewed with patient.  Reproductive/Obstetrics                            Anesthesia Physical Anesthesia Plan  ASA: 2  Anesthesia Plan: General   Post-op Pain Management:    Induction: Intravenous  PONV Risk Score and Plan: 3 and Treatment may vary due to age or medical condition, Ondansetron, Dexamethasone and Midazolam  Airway Management Planned: LMA  Additional Equipment: None  Intra-op Plan:   Post-operative Plan: Extubation in OR  Informed Consent: I have reviewed the patients History and Physical, chart, labs and discussed the procedure including the risks, benefits and alternatives for the proposed anesthesia with the patient or authorized representative who has indicated his/her understanding and acceptance.     Dental advisory given  Plan Discussed with: CRNA  Anesthesia Plan Comments:        Anesthesia Quick Evaluation

## 2021-02-18 ENCOUNTER — Ambulatory Visit (HOSPITAL_BASED_OUTPATIENT_CLINIC_OR_DEPARTMENT_OTHER): Payer: 59 | Admitting: Anesthesiology

## 2021-02-18 ENCOUNTER — Ambulatory Visit (HOSPITAL_BASED_OUTPATIENT_CLINIC_OR_DEPARTMENT_OTHER)
Admission: RE | Admit: 2021-02-18 | Discharge: 2021-02-18 | Disposition: A | Discharge status: Home / Self Care | Payer: 59 | Attending: Urology | Admitting: Urology

## 2021-02-18 ENCOUNTER — Encounter (HOSPITAL_BASED_OUTPATIENT_CLINIC_OR_DEPARTMENT_OTHER): Payer: Self-pay | Admitting: Urology

## 2021-02-18 ENCOUNTER — Encounter (HOSPITAL_BASED_OUTPATIENT_CLINIC_OR_DEPARTMENT_OTHER)
Admission: RE | Disposition: A | Discharge status: Home / Self Care | Payer: Self-pay | Source: Home / Self Care | Attending: Urology

## 2021-02-18 DIAGNOSIS — C6292 Malignant neoplasm of left testis, unspecified whether descended or undescended: Secondary | ICD-10-CM | POA: Insufficient documentation

## 2021-02-18 DIAGNOSIS — Z7982 Long term (current) use of aspirin: Secondary | ICD-10-CM | POA: Diagnosis not present

## 2021-02-18 DIAGNOSIS — Z7984 Long term (current) use of oral hypoglycemic drugs: Secondary | ICD-10-CM | POA: Insufficient documentation

## 2021-02-18 DIAGNOSIS — N5089 Other specified disorders of the male genital organs: Secondary | ICD-10-CM | POA: Diagnosis present

## 2021-02-18 DIAGNOSIS — Z79899 Other long term (current) drug therapy: Secondary | ICD-10-CM | POA: Diagnosis not present

## 2021-02-18 HISTORY — DX: Presence of spectacles and contact lenses: Z97.3

## 2021-02-18 HISTORY — DX: Type 2 diabetes mellitus without complications: E11.9

## 2021-02-18 HISTORY — PX: ORCHIECTOMY: SHX2116

## 2021-02-18 HISTORY — DX: Other specified disorders of the male genital organs: N50.89

## 2021-02-18 LAB — POCT I-STAT, CHEM 8
BUN: 10 mg/dL (ref 6–20)
Calcium, Ion: 1.31 mmol/L (ref 1.15–1.40)
Chloride: 101 mmol/L (ref 98–111)
Creatinine, Ser: 0.9 mg/dL (ref 0.61–1.24)
Glucose, Bld: 156 mg/dL — ABNORMAL HIGH (ref 70–99)
HCT: 44 % (ref 39.0–52.0)
Hemoglobin: 15 g/dL (ref 13.0–17.0)
Potassium: 4.2 mmol/L (ref 3.5–5.1)
Sodium: 139 mmol/L (ref 135–145)
TCO2: 25 mmol/L (ref 22–32)

## 2021-02-18 LAB — GLUCOSE, CAPILLARY: Glucose-Capillary: 160 mg/dL — ABNORMAL HIGH (ref 70–99)

## 2021-02-18 SURGERY — ORCHIECTOMY
Anesthesia: General | Site: Groin | Laterality: Left

## 2021-02-18 MED ORDER — ACETAMINOPHEN 500 MG PO TABS
ORAL_TABLET | ORAL | Status: AC
  Filled 2021-02-18: qty 2

## 2021-02-18 MED ORDER — PROPOFOL 10 MG/ML IV BOLUS
INTRAVENOUS | Status: DC | PRN
  Administered 2021-02-18: 200 mg via INTRAVENOUS

## 2021-02-18 MED ORDER — HYDROCODONE-ACETAMINOPHEN 5-325 MG PO TABS: 1.0000 | ORAL_TABLET | ORAL | 0 refills | Status: DC | PRN

## 2021-02-18 MED ORDER — PHENYLEPHRINE 40 MCG/ML (10ML) SYRINGE FOR IV PUSH (FOR BLOOD PRESSURE SUPPORT)
PREFILLED_SYRINGE | INTRAVENOUS | Status: DC | PRN
  Administered 2021-02-18 (×3): 80 ug via INTRAVENOUS

## 2021-02-18 MED ORDER — MIDAZOLAM HCL 2 MG/2ML IJ SOLN
INTRAMUSCULAR | Status: AC
  Filled 2021-02-18: qty 2

## 2021-02-18 MED ORDER — 0.9 % SODIUM CHLORIDE (POUR BTL) OPTIME
TOPICAL | Status: DC | PRN
  Administered 2021-02-18: 500 mL

## 2021-02-18 MED ORDER — SODIUM CHLORIDE 0.9 % IV SOLN
INTRAVENOUS | Status: AC
  Filled 2021-02-18: qty 2

## 2021-02-18 MED ORDER — PROMETHAZINE HCL 25 MG/ML IJ SOLN: 6.2500 mg | INTRAMUSCULAR | Status: DC | PRN

## 2021-02-18 MED ORDER — DEXAMETHASONE SODIUM PHOSPHATE 10 MG/ML IJ SOLN
INTRAMUSCULAR | Status: DC | PRN
  Administered 2021-02-18: 5 mg via INTRAVENOUS

## 2021-02-18 MED ORDER — ACETAMINOPHEN 500 MG PO TABS
1000.0000 mg | ORAL_TABLET | Freq: Once | ORAL | Status: AC
  Administered 2021-02-18: 1000 mg via ORAL

## 2021-02-18 MED ORDER — FENTANYL CITRATE (PF) 100 MCG/2ML IJ SOLN
INTRAMUSCULAR | Status: DC | PRN
  Administered 2021-02-18: 50 ug via INTRAVENOUS
  Administered 2021-02-18: 100 ug via INTRAVENOUS
  Administered 2021-02-18 (×3): 50 ug via INTRAVENOUS

## 2021-02-18 MED ORDER — OXYCODONE HCL 5 MG PO TABS: 5.0000 mg | ORAL_TABLET | Freq: Once | ORAL | Status: DC | PRN

## 2021-02-18 MED ORDER — OXYCODONE HCL 5 MG/5ML PO SOLN: 5.0000 mg | Freq: Once | ORAL | Status: DC | PRN

## 2021-02-18 MED ORDER — ONDANSETRON HCL 4 MG/2ML IJ SOLN
INTRAMUSCULAR | Status: AC
  Filled 2021-02-18: qty 2

## 2021-02-18 MED ORDER — LIDOCAINE HCL (PF) 2 % IJ SOLN
INTRAMUSCULAR | Status: AC
  Filled 2021-02-18: qty 5

## 2021-02-18 MED ORDER — FENTANYL CITRATE (PF) 100 MCG/2ML IJ SOLN: 25.0000 ug | INTRAMUSCULAR | Status: DC | PRN

## 2021-02-18 MED ORDER — MIDAZOLAM HCL 5 MG/5ML IJ SOLN
INTRAMUSCULAR | Status: DC | PRN
  Administered 2021-02-18: 2 mg via INTRAVENOUS

## 2021-02-18 MED ORDER — DEXAMETHASONE SODIUM PHOSPHATE 10 MG/ML IJ SOLN
INTRAMUSCULAR | Status: AC
  Filled 2021-02-18: qty 1

## 2021-02-18 MED ORDER — ONDANSETRON HCL 4 MG/2ML IJ SOLN
INTRAMUSCULAR | Status: DC | PRN
  Administered 2021-02-18: 4 mg via INTRAVENOUS

## 2021-02-18 MED ORDER — LIDOCAINE 2% (20 MG/ML) 5 ML SYRINGE
INTRAMUSCULAR | Status: DC | PRN
  Administered 2021-02-18: 100 mg via INTRAVENOUS

## 2021-02-18 MED ORDER — FENTANYL CITRATE (PF) 250 MCG/5ML IJ SOLN
INTRAMUSCULAR | Status: AC
  Filled 2021-02-18: qty 5

## 2021-02-18 MED ORDER — PROPOFOL 10 MG/ML IV BOLUS
INTRAVENOUS | Status: AC
  Filled 2021-02-18: qty 20

## 2021-02-18 MED ORDER — FENTANYL CITRATE (PF) 100 MCG/2ML IJ SOLN
INTRAMUSCULAR | Status: AC
  Filled 2021-02-18: qty 2

## 2021-02-18 MED ORDER — LACTATED RINGERS IV SOLN: INTRAVENOUS | Status: DC

## 2021-02-18 MED ORDER — SODIUM CHLORIDE 0.9 % IV SOLN
2.0000 g | INTRAVENOUS | Status: AC
  Administered 2021-02-18: 2 g via INTRAVENOUS

## 2021-02-18 MED ORDER — BUPIVACAINE HCL (PF) 0.25 % IJ SOLN
INTRAMUSCULAR | Status: DC | PRN
  Administered 2021-02-18: 20 mL

## 2021-02-18 SURGICAL SUPPLY — 42 items
ADH SKN CLS APL DERMABOND .7 (GAUZE/BANDAGES/DRESSINGS) ×1
BLADE CLIPPER SENSICLIP SURGIC (BLADE) ×2 IMPLANT
BLADE SURG 15 STRL LF DISP TIS (BLADE) ×1 IMPLANT
BLADE SURG 15 STRL SS (BLADE) ×2
BNDG GAUZE ELAST 4 BULKY (GAUZE/BANDAGES/DRESSINGS) ×2 IMPLANT
COVER BACK TABLE 60X90IN (DRAPES) ×2 IMPLANT
COVER MAYO STAND STRL (DRAPES) ×2 IMPLANT
DERMABOND ADVANCED (GAUZE/BANDAGES/DRESSINGS) ×1
DERMABOND ADVANCED .7 DNX12 (GAUZE/BANDAGES/DRESSINGS) ×1 IMPLANT
DRAIN PENROSE 0.25X18 (DRAIN) ×2 IMPLANT
DRAPE INCISE IOBAN 66X45 STRL (DRAPES) ×2 IMPLANT
DRAPE LAPAROTOMY 100X72 PEDS (DRAPES) ×2 IMPLANT
ELECT REM PT RETURN 9FT ADLT (ELECTROSURGICAL) ×2
ELECTRODE REM PT RTRN 9FT ADLT (ELECTROSURGICAL) ×1 IMPLANT
GAUZE 4X4 16PLY ~~LOC~~+RFID DBL (SPONGE) ×4 IMPLANT
GLOVE SURG ENC MOIS LTX SZ6.5 (GLOVE) ×2 IMPLANT
GLOVE SURG POLYISO LF SZ6.5 (GLOVE) ×2 IMPLANT
GLOVE SURG UNDER POLY LF SZ6.5 (GLOVE) ×2 IMPLANT
GLOVE SURG UNDER POLY LF SZ7 (GLOVE) ×2 IMPLANT
GOWN STRL REUS W/TWL LRG LVL3 (GOWN DISPOSABLE) ×4 IMPLANT
HEMOSTAT ARISTA ABSORB 3G PWDR (HEMOSTASIS) ×2 IMPLANT
KIT TURNOVER CYSTO (KITS) ×2 IMPLANT
NEEDLE HYPO 25X1 1.5 SAFETY (NEEDLE) ×2 IMPLANT
NS IRRIG 500ML POUR BTL (IV SOLUTION) ×2 IMPLANT
PACK BASIN DAY SURGERY FS (CUSTOM PROCEDURE TRAY) ×2 IMPLANT
PENCIL SMOKE EVACUATOR (MISCELLANEOUS) ×2 IMPLANT
SUPPORT SCROTAL LG STRP (MISCELLANEOUS) ×2 IMPLANT
SUT MNCRL AB 4-0 PS2 18 (SUTURE) ×2 IMPLANT
SUT SILK 0 SH 30 (SUTURE) ×2 IMPLANT
SUT SILK 0 TIES 10X30 (SUTURE) ×2 IMPLANT
SUT SILK 2 0 (SUTURE) ×2
SUT SILK 2-0 18XBRD TIE 12 (SUTURE) ×1 IMPLANT
SUT VIC AB 2-0 SH 27 (SUTURE) ×2
SUT VIC AB 2-0 SH 27XBRD (SUTURE) ×1 IMPLANT
SUT VIC AB 3-0 SH 27 (SUTURE) ×2
SUT VIC AB 3-0 SH 27X BRD (SUTURE) ×1 IMPLANT
SYR BULB IRRIG 60ML STRL (SYRINGE) ×2 IMPLANT
SYR CONTROL 10ML LL (SYRINGE) ×2 IMPLANT
TOWEL OR 17X26 10 PK STRL BLUE (TOWEL DISPOSABLE) ×2 IMPLANT
TRAY DSU PREP LF (CUSTOM PROCEDURE TRAY) ×2 IMPLANT
TUBE CONNECTING 12X1/4 (SUCTIONS) ×2 IMPLANT
YANKAUER SUCT BULB TIP NO VENT (SUCTIONS) ×2 IMPLANT

## 2021-02-18 NOTE — Interval H&P Note (Signed)
History and Physical Interval Note:  02/18/2021 12:19 PM  Leonard Garrett  has presented today for surgery, with the diagnosis of LEFT TESTICULAR MASS.  The various methods of treatment have been discussed with the patient and family. After consideration of risks, benefits and other options for treatment, the patient has consented to  Procedure(s) with comments: [place] (Left) - 90 MINS as a surgical intervention.  The patient's history has been reviewed, patient examined, no change in status, stable for surgery.  I have reviewed the patient's chart and labs.  Questions were answered to the patient's satisfaction.     [name] D Jaylianna Tatlock

## 2021-02-18 NOTE — H&P (Signed)
CC/HPI: cc: left testicular swelling   01/27/21: 55 year old man comes in with several week history of left testicular swelling. It is not particularly painful to him. He denies any fevers, change in urinary symptoms or hematuria. He does have nocturia at baseline. No family history of prostate cancer.   02/14/21: 55 year old man with left testicular swelling returns for scrotal ultrasound. 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 Scrotal ultrasound was done today which shows diffusely abnormal left testicle concerning for malignancy.     ALLERGIES: No Known Drug Allergies    MEDICATIONS: Aspirin  Metformin Hcl 500 mg tablet  Amlodipine Besilate  Irbesartan 150 mg tablet  Multivitamin  Omega 3     GU PSH: Vasectomy     NON-GU PSH: Knee Arthroscopy, Right Repair Retinal Detach, Cplx Tonsillectomy Upper Extremity Fracture Orthosis, Forearm, Hand With Wrist Hinge, Custom-fabricated     GU PMH: Hydrocele - 01/27/2021    NON-GU PMH: Diabetes Type 2 Hyperlipidemia, unspecified Hypertension    FAMILY HISTORY: 2 sons - Runs in Family Amyotrophic Lateral Sclerosis - Runs in Family   SOCIAL HISTORY: Marital Status: Widowed Preferred Language: English; Ethnicity: Not Hispanic Or Latino; Race: White Current Smoking Status: Patient has never smoked.   Tobacco Use Assessment Completed: Used Tobacco in last 30 days? Does drink.  Drinks 1 caffeinated drink per day.    REVIEW OF SYSTEMS:    GU Review Male:   Patient denies frequent urination, hard to postpone urination, burning/ pain with urination, get up at night to urinate, leakage of urine, stream starts and stops, trouble starting your stream, have to strain to urinate , erection problems, and penile pain.  Gastrointestinal (Upper):   Patient denies nausea, vomiting, and indigestion/ heartburn.  Gastrointestinal (Lower):   Patient denies diarrhea and constipation.   Constitutional:   Patient denies fever, night sweats, weight loss, and fatigue.  Skin:   Patient denies skin rash/ lesion and itching.  Eyes:   Patient denies blurred vision and double vision.  Ears/ Nose/ Throat:   Patient denies sore throat and sinus problems.  Hematologic/Lymphatic:   Patient denies easy bruising and swollen glands.  Cardiovascular:   Patient denies leg swelling and chest pains.  Respiratory:   Patient denies cough and shortness of breath.  Endocrine:   Patient denies excessive thirst.  Musculoskeletal:   Patient denies back pain and joint pain.  Neurological:   Patient denies headaches and dizziness.  Psychologic:   Patient denies depression and anxiety.   VITAL SIGNS:      02/14/2021 11:47 AM  Weight 230 lb / 104.33 kg  Height 69 in / 175.26 cm  BP 150/85 mmHg  Pulse 73 /min  Temperature 97.3 F / 36.2 C  BMI 34.0 kg/m   GU PHYSICAL EXAMINATION:    Scrotum: No lesions. No edema. No cysts. No warts.  Testes: Left testicle enlarged and firm, right side normal  Urethral Meatus: Normal size. No lesion, no wart, no discharge, no polyp. Normal location.  Penis: Circumcised, no warts, no cracks. No dorsal Peyronie's plaques, no left corporal Peyronie's plaques, no right corporal Peyronie's plaques, no scarring, no warts. No balanitis, no meatal stenosis.   MULTI-SYSTEM PHYSICAL EXAMINATION:    Constitutional: Well-nourished. No physical deformities. Normally developed. Good grooming.  Neck: Neck symmetrical, not swollen. Normal tracheal position.  Respiratory: No labored breathing, no use of accessory muscles.   Skin: No paleness, no jaundice, no cyanosis. No lesion, no ulcer,  no rash.  Neurologic / Psychiatric: Oriented to time, oriented to place, oriented to person. No depression, no anxiety, no agitation.  Gastrointestinal: No rigidity  Eyes: Normal conjunctivae. Normal eyelids.  Ears, Nose, Mouth, and Throat: Left ear no scars, no lesions, no masses. Right ear  no scars, no lesions, no masses. Nose no scars, no lesions, no masses. Normal hearing. Normal lips.  Musculoskeletal: Normal gait and station of head and neck.     Complexity of Data:  Records Review:   Previous Patient Records  Urine Test Review:   Urinalysis  X-Ray Review: Scrotal Ultrasound: Reviewed Films. Discussed With Patient. Left testicle with heterogenic with cystic areas concerning for malignancy.   Notes:                     07/31/2019: BUN 13, creatinine 1.09   PROCEDURES:         Scrotal Ultrasound - 58850  Right Testicle: Length: 3.8 cm  Height: 2.5 cm  Width: 3.0 cm  Left Testicle: Length: 7.1 cm  Height: 5.1 cm  Width: 6.0 cm  Left Testis/Epididymis:  Left testicle diffusely abnormal. Heterogeneous echotexture with cystic and solid components.  Right Testis/Epididymis:  Within Normal Limits       Patient confirmed No Neulasta OnPro Device.           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    C.T. Abdomen/Pelvis With I.V. Contrast          Schedule         Document Letter(s):  Created for Patient: Clinical Summary         Notes:   I reviewed their scrotal ultrasound with the patient and showed him actual images of left testicle which appears concerning for malignancy. There is no hydrocele present. I discussed proceeding with a left inguinal radical orchiectomy as well as tumor markers and abdominal imaging. The risks and benefits of this procedure were discussed with the patient in detail  including but not limited to pain, infection, bleeding, damage to surrounding structures, nerve injury, need for future procedures/treatment, possibility of it being benign. He understands that his right testicle should present of testosterone for his body however testosterone place may can be considered in the future. I also briefly discussed up testicular prosthesis if he should desire that in the future as well. I discussed that these are usually fast growing and would recommend orchiectomy and the next week. Patient was agreeable be scheduled for surgery.

## 2021-02-18 NOTE — Op Note (Signed)
Preoperative diagnosis:  left testicular mass    Postoperative diagnosis:  same    Procedure: left radical orchiectomy   Surgeon: Jacalyn Lefevre, MD   Anesthesia: General   Complications: None   Intraoperative findings: firm, enlarged left testicle   EBL: Minimal   Specimens:  Left testicle and spermatic cord.   Indication:  Leonard Garrett is a 55 y.o.   patient with palpably abnormal left testicle.  Ultrasound of that testicle suggested a diffusely heterogenic abnormal testicle concerning for malignancy.  After reviewing the management options for treatment, he elected to proceed with the above surgical procedure(s). We have discussed the potential benefits and risks of the procedure, side effects of the proposed treatment, the likelihood of the patient achieving the goals of the procedure, and any potential problems that might occur during the procedure or recuperation. Informed consent has been obtained.   Description of procedure:   The patient was taken to the operating room and general anesthesia was induced.  The patient was placed in the supine position, prepped and draped in the usual sterile fashion, and preoperative antibiotics were administered. A preoperative time-out was performed.    A 6 cm incision was made over the left inguinal region dissected down to the external oblique fascia. This was then opened and the ilioinguinal nerve spared. The spermatic cord was then encircled with blunt dissection a Penrose drain was passed around the spermatic cord and used as a tourniquet. Dissection of the spermatic cord distally down into the scrotum then ensued using combination of blunt dissection and electrocautery. The testicle was then passed up through the scrotum and into the field. The gubernaculum was then taken with electrocautery. Once the testicle was free from the scrotum I dissected more proximally to the internal inguinal ring. This point I suture ligated the artery and the  vas deferens separately using silk suture. The specimen was then removed and sent to pathology as left testicle and spermatic cord.   Hemostasis was then meticulously achieved. The external oblique aponeurosis was then reapproximated with 2-0 Vicryl in a running fashion. The wound was then irrigated and hemostasis again ensured. Scarpa's fascia was then closed with a 3-0 Vicryl. The skin was closed with a 4-0 Monocryl.   The patient tolerated the procedure well. At the end of the case all laps and needles and sponges were accounted for. The patient was transferred to the PACU in stable condition.

## 2021-02-18 NOTE — Transfer of Care (Signed)
Immediate Anesthesia Transfer of Care Note  Patient: Leonard Garrett  Procedure(s) Performed: INGUINAL RADICAL ORCHIECTOMY (Left: Groin)  Patient Location: PACU  Anesthesia Type:General  Level of Consciousness: awake, alert , oriented and patient cooperative  Airway & Oxygen Therapy: Patient Spontanous Breathing  Post-op Assessment: Report given to RN and Post -op Vital signs reviewed and stable  Post vital signs: Reviewed and stable  Last Vitals:  Vitals Value Taken Time  BP 157/97 02/18/21 1401  Temp    Pulse 93 02/18/21 1403  Resp 28 02/18/21 1403  SpO2 93 % 02/18/21 1403  Vitals shown include unvalidated device data.  Last Pain:  Vitals:   02/18/21 1132  PainSc: 0-No pain      Patients Stated Pain Goal: 7 (66/81/59 [id number])  Complications: No notable events documented.

## 2021-02-18 NOTE — Anesthesia Procedure Notes (Signed)
Procedure Name: LMA Insertion Date/Time: 02/18/2021 12:37 PM Performed by: Rogers Blocker, CRNA Pre-anesthesia Checklist: Patient identified, Emergency Drugs available, Suction available and Patient being monitored Patient Re-evaluated:Patient Re-evaluated prior to induction Oxygen Delivery Method: Circle System Utilized Preoxygenation: Pre-oxygenation with 100% oxygen Induction Type: IV induction Ventilation: Mask ventilation without difficulty LMA: LMA inserted LMA Size: 5.0 Number of attempts: 1 Placement Confirmation: positive ETCO2 Tube secured with: Tape Dental Injury: Teeth and Oropharynx as per pre-operative assessment

## 2021-02-18 NOTE — Discharge Instructions (Addendum)
Orchiectomy: POST OP INSTRUCTIONS  DIET: Follow a light bland diet the first 24 hours after arrival home, such as soup, liquids, crackers, etc.  Be sure to include lots of fluids daily.  Avoid fast food or heavy meals as your are more likely to get nauseated.  Eat a low fat the next few days after surgery. Take your usually prescribed home medications unless otherwise directed. PAIN CONTROL: Pain is best controlled by a usual combination of three different methods TOGETHER: Ice/Heat Over the counter pain medication Prescription pain medication Most patients will experience some swelling and bruising around the incision.  Ice packs or heating pads (30-60 minutes up to 6 times a day) will help. Use ice for the first few days to help decrease swelling and bruising, then switch to heat to help relax tight/sore spots and speed recovery.  Some people prefer to use ice alone, heat alone, alternating between ice & heat.  Experiment to what works for you.  Swelling and bruising can take several weeks to resolve.   It is helpful to take an over-the-counter pain medication regularly for the first few weeks.  Choose one of the following that works best for you: Naproxen (Aleve, etc)  Two 220mg  tabs twice a day Ibuprofen (Advil, etc) Three 200mg  tabs four times a day (every meal & bedtime) Acetaminophen (Tylenol, etc) 325-650mg  four times a day (every meal & bedtime). May take tylenol again at 5:45pm. A  prescription for pain medication should be given to you upon discharge.  Take your pain medication as prescribed.  If you are having problems/concerns with the prescription medicine (does not control pain, nausea, vomiting, rash, itching, etc), please call us [phone] to see if we need to switch you to a different pain medicine that will work better for you and/or control your side effect better. If you need a refill on your pain medication, please contactus. Avoid getting constipated.  Between the surgery  and the pain medications, it is common to experience some constipation.  Increasing fluid intake and taking a fiber supplement (such as Metamucil, Citrucel, FiberCon, MiraLax, etc) 1-2 times a day regularly will usually help prevent this problem from occurring.  A mild laxative (prune juice, Milk of Magnesia, MiraLax, etc) should be taken according to package directions if there are no bowel movements after 48 hours.   Wash / shower every day.  You may shower over the dressings as they are waterproof.   Remove your waterproof bandages 5 days after surgery.  You may leave the incision open to air.  You may replace a dressing/Band-Aid to cover the incision for comfort if you wish.  Continue to shower over incision(s) after the dressing is off. ACTIVITIES as tolerated:   You may resume regular (light) daily activities beginning the next day--such as daily self-care, walking, climbing stairs--gradually increasing activities as tolerated.  If you can walk 30 minutes without difficulty, it is safe to try more intense activity such as jogging, treadmill, bicycling, low-impact aerobics, swimming, etc. Save the most intensive and strenuous activity for last such as sit-ups, heavy lifting, contact sports, etc  Refrain from any heavy lifting or straining until you are off narcotics for pain control.   DO NOT PUSH THROUGH PAIN.  Let pain be your guide: If it hurts to do something, don't do it.  Pain is your body warning you to avoid that activity for another week until the pain goes down. You may drive when you are no longer taking prescription  pain medication, you can comfortably wear a seatbelt, and you can safely maneuver your car and apply brakes. You may have sexual intercourse when it is comfortable.  FOLLOW UP in our office Please call [facility] at [phone] to set up an appointment to see your surgeon in the office for a follow-up appointment approximately 2-3 weeks after your surgery, if you have  not been already scheduled for follow-up. Make sure that you call for this appointment the day you arrive home to insure a convenient appointment time. 9.  IF YOU HAVE DISABILITY OR FAMILY LEAVE FORMS, BRING THEM TO THE OFFICE FOR PROCESSING.  DO NOT GIVE THEM TO YOUR DOCTOR.  WHEN TO CALL us [phone]: Poor pain control Reactions / problems with new medications (rash/itching, nausea, etc)  Fever over 101.5 F (38.5 C) Inability to urinate Nausea and/or vomiting Worsening swelling or bruising Continued bleeding from incision. Increased pain, redness, or drainage from the incision   The clinic staff is available to answer your questions during regular business hours (8:30am-5pm).  Please don't hesitate to call and ask to speak to one of our nurses for clinical concerns.   If you have a medical emergency, go to the nearest emergency room or call 911.   Post Anesthesia Home Care Instructions  Activity: Get plenty of rest for the remainder of the day. A responsible individual must stay with you for 24 hours following the procedure.  For the next 24 hours, DO NOT: -Drive a car -Paediatric nurse -Drink alcoholic beverages -Take any medication unless instructed by your physician -Make any legal decisions or sign important papers.  Meals: Start with liquid foods such as gelatin or soup. Progress to regular foods as tolerated. Avoid greasy, spicy, heavy foods. If nausea and/or vomiting occur, drink only clear liquids until the nausea and/or vomiting subsides. Call your physician if vomiting continues.  Special Instructions/Symptoms: Your throat may feel dry or sore from the anesthesia or the breathing tube placed in your throat during surgery. If this causes discomfort, gargle with warm salt water. The discomfort should disappear within 24 hours.

## 2021-02-20 NOTE — Anesthesia Postprocedure Evaluation (Signed)
Anesthesia Post Note  Patient: Leonard Garrett  Procedure(s) Performed: INGUINAL RADICAL ORCHIECTOMY (Left: Groin)     Patient location during evaluation: PACU Anesthesia Type: General Level of consciousness: awake and alert and oriented Pain management: pain level controlled Vital Signs Assessment: post-procedure vital signs reviewed and stable Respiratory status: spontaneous breathing, nonlabored ventilation and respiratory function stable Cardiovascular status: blood pressure returned to baseline and stable Postop Assessment: no apparent nausea or vomiting Anesthetic complications: no   No notable events documented.  Last Vitals:  Vitals:   02/18/21 1430 02/18/21 1545  BP: 134/75 (!) 144/85  Pulse: 76 72  Resp: 14 16  Temp: 37.3 C (!) 36.2 C  SpO2: 95% 94%    Last Pain:  Vitals:   02/18/21 1545  PainSc: 2                  Caliope Ruppert A.

## 2021-02-21 ENCOUNTER — Encounter (HOSPITAL_BASED_OUTPATIENT_CLINIC_OR_DEPARTMENT_OTHER): Payer: Self-pay | Admitting: Urology

## 2021-02-21 LAB — SURGICAL PATHOLOGY

## 2021-02-22 ENCOUNTER — Ambulatory Visit: Payer: No Typology Code available for payment source | Admitting: Family Medicine

## 2021-03-24 ENCOUNTER — Encounter: Payer: Self-pay | Admitting: Oncology

## 2021-03-24 ENCOUNTER — Inpatient Hospital Stay: Payer: PRIVATE HEALTH INSURANCE | Attending: Oncology | Admitting: Oncology

## 2021-03-24 ENCOUNTER — Other Ambulatory Visit: Payer: Self-pay

## 2021-03-24 VITALS — BP 160/90 | HR 80 | Temp 98.6°F | Resp 18 | Ht 69.0 in | Wt 238.4 lb

## 2021-03-24 DIAGNOSIS — C6290 Malignant neoplasm of unspecified testis, unspecified whether descended or undescended: Secondary | ICD-10-CM | POA: Insufficient documentation

## 2021-03-24 DIAGNOSIS — C6292 Malignant neoplasm of left testis, unspecified whether descended or undescended: Secondary | ICD-10-CM

## 2021-03-24 NOTE — Progress Notes (Signed)
Reason for the request:    Testicular cancer  HPI: I was asked by Dr. Claudia Desanctis to evaluate Leonard Garrett for the evaluation of testicular cancer.  He is a 55 year old man with history of hypertension and hyperlipidemia who was found to have left testicular swelling in June 2022.  He underwent evaluation by Dr. Claudia Desanctis and ultrasound showed a abnormal left testicle concerning for malignancy.  Laboratory testing including tumor markers showed an elevated LDH of 383, beta-hCG of 14 alpha-fetoprotein of 11.8.  He underwent left orchiectomy on February 18, 2021.  The final pathology showed a 6.2 cm tumor that is 50% embryonal, 40% yolk sac and 10% teratoma.  Final pathological staging was T2 disease.  His a post orchiectomy alpha-fetoprotein was 4.4 with beta-hCG less than 1.  CT scan on February 25, 2020 showed no evidence of metastatic disease although there is a mildly enlarged right external iliac lymph node measuring 1.1 cm.  Clinically, he reports feeling well and has fully recovered from his operation.  He denies any chest pain shortness of breath or difficulty breathing.  He denies any recent hematochezia or melena.   He does not report any headaches, blurry vision, syncope or seizures. Does not report any fevers, chills or sweats.  Does not report any cough, wheezing or hemoptysis.  Does not report any chest pain, palpitation, orthopnea or leg edema.  Does not report any nausea, vomiting or abdominal pain.  Does not report any constipation or diarrhea.  Does not report any skeletal complaints.    Does not report frequency, urgency or hematuria.  Does not report any skin rashes or lesions. Does not report any heat or cold intolerance.  Does not report any lymphadenopathy or petechiae.  Does not report any anxiety or depression.  Remaining review of systems is negative.     Past Medical History:  Diagnosis Date   History of fatty infiltration of liver 2017   Hyperlipidemia    Hypertension    followed by pcp    Mass of left testis    Seasonal allergies    Type 2 diabetes mellitus ([place])    followed by pcp----  (02-15-2021  pt stated does not check blood surgar at home)   Wears glasses   :   Past Surgical History:  Procedure Laterality Date   CLOSED REDUCTION FOREARM FRACTURE Left 1978   KNEE ARTHROSCOPY Right 2011   ORCHIECTOMY Left 02/18/2021   Procedure: INGUINAL RADICAL ORCHIECTOMY;  Surgeon: Robley Fries, MD;  Location: [facility];  Service: Urology;  Laterality: Left;   RETINAL DETACHMENT SURGERY Right 2011   TONSILLECTOMY  1970  :   Current Outpatient Medications:    amLODipine (NORVASC) 5 MG tablet, TAKE 1 TABLET(5 MG) BY MOUTH DAILY (Patient taking differently: Take 5 mg by mouth daily.), Disp: 90 tablet, Rfl: 3   aspirin 81 MG tablet, Take 81 mg by mouth daily., Disp: , Rfl:    HYDROcodone-acetaminophen (NORCO/VICODIN) 5-325 MG tablet, Take 1 tablet by mouth every 4 (four) hours as needed for moderate pain., Disp: 20 tablet, Rfl: 0   irbesartan (AVAPRO) 150 MG tablet, TAKE 2 TABLETS BY MOUTH EVERY NIGHT AT BEDTIME (Patient taking differently: Take 300 mg by mouth daily.), Disp: 60 tablet, Rfl: 11   metFORMIN (GLUCOPHAGE) 500 MG tablet, TAKE 1 TABLET(500 MG) BY MOUTH TWICE DAILY WITH A MEAL (Patient taking differently: Take 500 mg by mouth 2 (two) times daily with a meal. TAKE 1 TABLET(500 MG) BY MOUTH TWICE DAILY  WITH A MEAL), Disp: 180 tablet, Rfl: 0   Multiple Vitamins-Minerals (MULTIVITAMIN PO), Take by mouth daily at 12 noon., Disp: , Rfl:    Omega-3 Fatty Acids (FISH OIL) 1200 MG CPDR, Take by mouth daily., Disp: , Rfl: :  No Known Allergies:   Family History  Problem Relation Age of Onset   Colon cancer Neg Hx    Esophageal cancer Neg Hx    Rectal cancer Neg Hx    Stomach cancer Neg Hx    ALS Father   :   Social History   Socioeconomic History   Marital status: Widowed    Spouse name: Not on file   Number of children: Not on file   Years of  education: Not on file   Highest education level: Not on file  Occupational History   Not on file  Tobacco Use   Smoking status: Never   Smokeless tobacco: Never  Vaping Use   Vaping Use: Never used  Substance and Sexual Activity   Alcohol use: Not Currently    Comment: occasional   Drug use: Never   Sexual activity: Not on file    Comment: VASECTOMY  199  Other Topics Concern   Not on file  Social History Narrative   Not on file   Social Determinants of Health   Financial Resource Strain: Not on file  Food Insecurity: Not on file  Transportation Needs: Not on file  Physical Activity: Not on file  Stress: Not on file  Social Connections: Not on file  Intimate Partner Violence: Not on file  :  Pertinent items are noted in HPI.  Exam: Blood pressure (!) 160/90, pulse 80, temperature 98.6 F (37 C), temperature source Oral, resp. rate 18, height '5\' 9"'$  (1.753 m), weight 238 lb 6.4 oz (108.1 kg), SpO2 99 %.  ECOG 0 General appearance: alert and cooperative appeared without distress. Head: atraumatic without any abnormalities. Eyes: conjunctivae/corneas clear. PERRL.  Sclera anicteric. Throat: lips, mucosa, and tongue normal; without oral thrush or ulcers. Resp: clear to auscultation bilaterally without rhonchi, wheezes or dullness to percussion. Cardio: regular rate and rhythm, S1, S2 normal, no murmur, click, rub or gallop GI: soft, non-tender; bowel sounds normal; no masses,  no organomegaly Skin: Skin color, texture, turgor normal. No rashes or lesions Lymph nodes: Cervical, supraclavicular, and axillary nodes normal. Neurologic: Grossly normal without any motor, sensory or deep tendon reflexes. Musculoskeletal: No joint deformity or effusion.   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  He presented with left testicular mass and found to have 50% embryonal component with 40% yolk sac, 10%  teratoma.     The natural course of this disease was reviewed at this time and treatment choices were discussed. I estimated the risk of relapse at this time after orchiectomy of close to 50% because of his embryonal component and LVI.  He understands the majority of patients are cured with orchiectomy along and this disease is treatable with survival rate close to 100% with salvage therapy if he relapses.   Treatment options however were discussed including active surveillance versus 1-2 cycles of BEP chemotherapy versus RPLND surgery.  The risks and benefits of all these approaches were discussed at this time.     Complication associated with chemotherapy include nausea, vomiting, myelosuppression and fertility issues.  Complication associated with surgery and include retrograde ejaculation as well as postoperative thrombosis, infection among others were  reviewed.  Infertility issues was also emphasized after surgery.    Active surveillance protocol includes.  Physical examination, tumor markers and chest x-ray every 3 months with imaging studies of the abdomen and pelvis every 6 months for the first 2 years is recommended.   He will think about these options and let me know in the near future.  2.  IV access: Peripheral veins will be in use if he decides to proceed with chemotherapy.  3.  Pulmonary function surveillance: Pulmonary function test and DLCO will be performed prior to proceeding with chemotherapy.  Bleomycin toxicity was discussed today in detail possibility of eliminating bleomycin was also reviewed.  4.  Follow-up: Will be determined pending his decision how to proceed.   60  minutes were dedicated to this visit. The time was spent on reviewing laboratory data, imaging studies, discussing treatment options, and answering questions regarding future plan.      A copy of this consult has been forwarded to the requesting physician.

## 2021-03-28 ENCOUNTER — Other Ambulatory Visit: Payer: Self-pay | Admitting: *Deleted

## 2021-03-28 ENCOUNTER — Telehealth: Payer: Self-pay | Admitting: Oncology

## 2021-03-28 ENCOUNTER — Other Ambulatory Visit: Payer: Self-pay | Admitting: Oncology

## 2021-03-28 ENCOUNTER — Telehealth: Payer: Self-pay | Admitting: *Deleted

## 2021-03-28 DIAGNOSIS — C6292 Malignant neoplasm of left testis, unspecified whether descended or undescended: Secondary | ICD-10-CM | POA: Insufficient documentation

## 2021-03-28 NOTE — Progress Notes (Signed)
START ON PATHWAY REGIMEN - Testicular     A cycle is every 21 days:     Bleomycin      Etoposide      Cisplatin   **Always confirm dose/schedule in your pharmacy ordering system**  Patient Characteristics: Nonseminoma, Newly Diagnosed, Stage I, Not a Candidate for Surveillance AJCC T Category: pT2 AJCC N Category: pN0 AJCC M Category: cM0 AJCC S Category Post-Orchiectomy: S0 AJCC 8 Stage Grouping: IB Histology: Nonseminoma Intent of Therapy: Curative Intent, Discussed with Patient

## 2021-03-28 NOTE — Telephone Encounter (Signed)
Scheduled appts per 8/22 sch msg. Pt aware.  

## 2021-03-28 NOTE — Telephone Encounter (Signed)
Patient returned call to advise that he has decided to proceed with the treatment plan that Dr Alen Blew recommended.  According to last office note patient needs to have some preliminary tests done and then chemo ed/lab/md/infusion scheduled.  Routed to Dr Alen Blew to map out details needed for scheduling message and to place orders for required tests.

## 2021-03-29 ENCOUNTER — Encounter: Payer: Self-pay | Admitting: Oncology

## 2021-03-31 ENCOUNTER — Other Ambulatory Visit: Payer: Self-pay

## 2021-03-31 ENCOUNTER — Encounter: Payer: Self-pay | Admitting: Oncology

## 2021-03-31 ENCOUNTER — Inpatient Hospital Stay: Payer: PRIVATE HEALTH INSURANCE

## 2021-03-31 ENCOUNTER — Other Ambulatory Visit: Payer: Self-pay | Admitting: *Deleted

## 2021-03-31 DIAGNOSIS — C6292 Malignant neoplasm of left testis, unspecified whether descended or undescended: Secondary | ICD-10-CM

## 2021-03-31 MED ORDER — PROCHLORPERAZINE MALEATE 10 MG PO TABS
10.0000 mg | ORAL_TABLET | Freq: Four times a day (QID) | ORAL | 0 refills | Status: DC | PRN
Start: 2021-03-31 — End: 2021-10-03

## 2021-04-05 ENCOUNTER — Other Ambulatory Visit: Payer: Self-pay | Admitting: Oncology

## 2021-04-06 LAB — SARS CORONAVIRUS 2 (TAT 6-24 HRS): SARS Coronavirus 2: NEGATIVE

## 2021-04-07 ENCOUNTER — Other Ambulatory Visit: Payer: Self-pay

## 2021-04-07 ENCOUNTER — Ambulatory Visit (HOSPITAL_COMMUNITY)
Admission: RE | Admit: 2021-04-07 | Discharge: 2021-04-07 | Disposition: A | Discharge status: Home / Self Care | Payer: 59 | Source: Ambulatory Visit | Attending: Oncology | Admitting: Oncology

## 2021-04-07 DIAGNOSIS — C6292 Malignant neoplasm of left testis, unspecified whether descended or undescended: Secondary | ICD-10-CM | POA: Insufficient documentation

## 2021-04-07 LAB — PULMONARY FUNCTION TEST
DL/VA % pred: 101 %
DL/VA: 4.36 ml/min/mmHg/L
DLCO unc % pred: 93 %
DLCO unc: 25.46 ml/min/mmHg
FEF 25-75 Post: 4.27 L/sec
FEF 25-75 Pre: 3.55 L/sec
FEF2575-%Change-Post: 20 %
FEF2575-%Pred-Post: 140 %
FEF2575-%Pred-Pre: 117 %
FEV1-%Change-Post: 5 %
FEV1-%Pred-Post: 97 %
FEV1-%Pred-Pre: 91 %
FEV1-Post: 3.48 L
FEV1-Pre: 3.29 L
FEV1FVC-%Change-Post: 1 %
FEV1FVC-%Pred-Pre: 107 %
FEV6-%Change-Post: 4 %
FEV6-%Pred-Post: 92 %
FEV6-%Pred-Pre: 88 %
FEV6-Post: 4.17 L
FEV6-Pre: 3.98 L
FEV6FVC-%Change-Post: 0 %
FEV6FVC-%Pred-Post: 104 %
FEV6FVC-%Pred-Pre: 104 %
FVC-%Change-Post: 4 %
FVC-%Pred-Post: 89 %
FVC-%Pred-Pre: 85 %
FVC-Post: 4.18 L
FVC-Pre: 4.01 L
Post FEV1/FVC ratio: 83 %
Post FEV6/FVC ratio: 100 %
Pre FEV1/FVC ratio: 82 %
Pre FEV6/FVC Ratio: 100 %
RV % pred: 85 %
RV: 1.83 L
TLC % pred: 94 %
TLC: 6.44 L

## 2021-04-07 MED ORDER — ALBUTEROL SULFATE (2.5 MG/3ML) 0.083% IN NEBU
2.5000 mg | INHALATION_SOLUTION | Freq: Once | RESPIRATORY_TRACT | Status: AC
  Administered 2021-04-07: 2.5 mg via RESPIRATORY_TRACT

## 2021-04-14 ENCOUNTER — Other Ambulatory Visit: Payer: Self-pay | Admitting: Family Medicine

## 2021-04-16 NOTE — Progress Notes (Signed)
Pharmacist Chemotherapy Monitoring - Initial Assessment    Anticipated start date: 04/25/21   The following has been reviewed per standard work regarding the patient's treatment regimen: The patient's diagnosis, treatment plan and drug doses, and organ/hematologic function Lab orders and baseline tests specific to treatment regimen  The treatment plan start date, drug sequencing, and pre-medications Prior authorization status  Patient's documented medication list, including drug-drug interaction screen and prescriptions for anti-emetics and supportive care specific to the treatment regimen The drug concentrations, fluid compatibility, administration routes, and timing of the medications to be used The patient's access for treatment and lifetime cumulative dose history, if applicable  The patient's medication allergies and previous infusion related reactions, if applicable   Changes made to treatment plan:  N/A  Follow up needed:  Pending authorization for treatment     Kennith Center, Pharm.D., CPP 04/16/2021'@11'$ :07 AM

## 2021-04-22 MED FILL — Dexamethasone Sodium Phosphate Inj 100 MG/10ML: INTRAMUSCULAR | Qty: 1 | Status: AC

## 2021-04-22 MED FILL — Fosaprepitant Dimeglumine For IV Infusion 150 MG (Base Eq): INTRAVENOUS | Qty: 5 | Status: AC

## 2021-04-25 ENCOUNTER — Inpatient Hospital Stay: Payer: 59

## 2021-04-25 ENCOUNTER — Inpatient Hospital Stay: Payer: 59 | Attending: Oncology

## 2021-04-25 ENCOUNTER — Other Ambulatory Visit: Payer: Self-pay

## 2021-04-25 VITALS — BP 162/89 | HR 82 | Temp 98.4°F | Resp 18 | Wt 240.0 lb

## 2021-04-25 DIAGNOSIS — Z79899 Other long term (current) drug therapy: Secondary | ICD-10-CM | POA: Diagnosis not present

## 2021-04-25 DIAGNOSIS — R197 Diarrhea, unspecified: Secondary | ICD-10-CM | POA: Diagnosis not present

## 2021-04-25 DIAGNOSIS — Z5111 Encounter for antineoplastic chemotherapy: Secondary | ICD-10-CM | POA: Diagnosis present

## 2021-04-25 DIAGNOSIS — C6292 Malignant neoplasm of left testis, unspecified whether descended or undescended: Secondary | ICD-10-CM

## 2021-04-25 DIAGNOSIS — R5383 Other fatigue: Secondary | ICD-10-CM | POA: Diagnosis not present

## 2021-04-25 DIAGNOSIS — R590 Localized enlarged lymph nodes: Secondary | ICD-10-CM | POA: Diagnosis not present

## 2021-04-25 LAB — CMP (CANCER CENTER ONLY)
ALT: 50 U/L — ABNORMAL HIGH (ref 0–44)
AST: 44 U/L — ABNORMAL HIGH (ref 15–41)
Albumin: 4 g/dL (ref 3.5–5.0)
Alkaline Phosphatase: 94 U/L (ref 38–126)
Anion gap: 13 (ref 5–15)
BUN: 14 mg/dL (ref 6–20)
CO2: 23 mmol/L (ref 22–32)
Calcium: 10 mg/dL (ref 8.9–10.3)
Chloride: 101 mmol/L (ref 98–111)
Creatinine: 1.1 mg/dL (ref 0.61–1.24)
GFR, Estimated: >60 mL/min (ref >60)
Glucose, Bld: 151 mg/dL — ABNORMAL HIGH (ref 70–99)
Potassium: 4.2 mmol/L (ref 3.5–5.1)
Sodium: 137 mmol/L (ref 135–145)
Total Bilirubin: 0.8 mg/dL (ref 0.3–1.2)
Total Protein: 7.5 g/dL (ref 6.5–8.1)

## 2021-04-25 LAB — CBC WITH DIFFERENTIAL (CANCER CENTER ONLY)
Abs Immature Granulocytes: 0.05 10*3/uL (ref 0.00–0.07)
Basophils Absolute: 0.1 10*3/uL (ref 0.0–0.1)
Basophils Relative: 1 %
Eosinophils Absolute: 0.2 10*3/uL (ref 0.0–0.5)
Eosinophils Relative: 2 %
HCT: 42.2 % (ref 39.0–52.0)
Hemoglobin: 15 g/dL (ref 13.0–17.0)
Immature Granulocytes: 1 %
Lymphocytes Relative: 21 %
Lymphs Abs: 2 10*3/uL (ref 0.7–4.0)
MCH: 31.8 pg (ref 26.0–34.0)
MCHC: 35.5 g/dL (ref 30.0–36.0)
MCV: 89.4 fL (ref 80.0–100.0)
Monocytes Absolute: 1 10*3/uL (ref 0.1–1.0)
Monocytes Relative: 11 %
Neutro Abs: 6.1 10*3/uL (ref 1.7–7.7)
Neutrophils Relative %: 64 %
Platelet Count: 268 10*3/uL (ref 150–400)
RBC: 4.72 MIL/uL (ref 4.22–5.81)
RDW: 13 % (ref 11.5–15.5)
WBC Count: 9.3 10*3/uL (ref 4.0–10.5)
nRBC: 0 % (ref 0.0–0.2)

## 2021-04-25 LAB — MAGNESIUM: Magnesium: 1.2 mg/dL — ABNORMAL LOW (ref 1.7–2.4)

## 2021-04-25 MED ORDER — MAGNESIUM SULFATE 2 GM/50ML IV SOLN
2.0000 g | Freq: Once | INTRAVENOUS | Status: AC
  Administered 2021-04-25: 2 g via INTRAVENOUS
  Filled 2021-04-25: qty 50

## 2021-04-25 MED ORDER — SODIUM CHLORIDE 0.9 % IV SOLN
10.0000 mg | Freq: Once | INTRAVENOUS | Status: AC
  Administered 2021-04-25: 10 mg via INTRAVENOUS
  Filled 2021-04-25: qty 10

## 2021-04-25 MED ORDER — SODIUM CHLORIDE 0.9 % IV SOLN
20.0000 mg/m2 | Freq: Once | INTRAVENOUS | Status: AC
  Administered 2021-04-25: 46 mg via INTRAVENOUS
  Filled 2021-04-25: qty 46

## 2021-04-25 MED ORDER — POTASSIUM CHLORIDE IN NACL 20-0.9 MEQ/L-% IV SOLN
Freq: Once | INTRAVENOUS | Status: AC
  Filled 2021-04-25: qty 1000

## 2021-04-25 MED ORDER — SODIUM CHLORIDE 0.9 % IV SOLN
100.0000 mg/m2 | Freq: Once | INTRAVENOUS | Status: AC
  Administered 2021-04-25: 230 mg via INTRAVENOUS
  Filled 2021-04-25: qty 11.5

## 2021-04-25 MED ORDER — PALONOSETRON HCL INJECTION 0.25 MG/5ML
0.2500 mg | Freq: Once | INTRAVENOUS | Status: AC
  Administered 2021-04-25: 0.25 mg via INTRAVENOUS
  Filled 2021-04-25: qty 5

## 2021-04-25 MED ORDER — SODIUM CHLORIDE 0.9 % IV SOLN: Freq: Once | INTRAVENOUS | Status: AC

## 2021-04-25 MED ORDER — SODIUM CHLORIDE 0.9 % IV SOLN
150.0000 mg | Freq: Once | INTRAVENOUS | Status: AC
  Administered 2021-04-25: 150 mg via INTRAVENOUS
  Filled 2021-04-25: qty 150

## 2021-04-25 MED FILL — Dexamethasone Sodium Phosphate Inj 100 MG/10ML: INTRAMUSCULAR | Qty: 1 | Status: AC

## 2021-04-25 NOTE — Progress Notes (Signed)
Per Dr. Alen Blew ok to proceed with pending magnesium level.

## 2021-04-25 NOTE — Patient Instructions (Signed)
[facility]  Discharge Instructions: Thank you for choosing [facility] to provide your oncology and hematology care.   If you have a lab appointment with the [facility], please go directly to the [facility] and check in at the registration area.   Wear comfortable clothing and clothing appropriate for easy access to any Portacath or PICC line.   We strive to give you quality time with your provider. You may need to reschedule your appointment if you arrive late (15 or more minutes).  Arriving late affects you and other patients whose appointments are after yours.  Also, if you miss three or more appointments without notifying the office, you may be dismissed from the clinic at the provider's discretion.      For prescription refill requests, have your pharmacy contact our office and allow 72 hours for refills to be completed.    Today you received the following chemotherapy and/or immunotherapy agents: Cisplatin/Etoposide.       To help prevent nausea and vomiting after your treatment, we encourage you to take your nausea medication as directed.  BELOW ARE SYMPTOMS THAT SHOULD BE REPORTED IMMEDIATELY: *FEVER GREATER THAN 100.4 F (38 C) OR HIGHER *CHILLS OR SWEATING *NAUSEA AND VOMITING THAT IS NOT CONTROLLED WITH YOUR NAUSEA MEDICATION *UNUSUAL SHORTNESS OF BREATH *UNUSUAL BRUISING OR BLEEDING *URINARY PROBLEMS (pain or burning when urinating, or frequent urination) *BOWEL PROBLEMS (unusual diarrhea, constipation, pain near the anus) TENDERNESS IN MOUTH AND THROAT WITH OR WITHOUT PRESENCE OF ULCERS (sore throat, sores in mouth, or a toothache) UNUSUAL RASH, SWELLING OR PAIN  UNUSUAL VAGINAL DISCHARGE OR ITCHING   Items with * indicate a potential emergency and should be followed up as soon as possible or go to the Emergency Department if any problems should occur.  Please show the CHEMOTHERAPY ALERT CARD or IMMUNOTHERAPY ALERT CARD at  check-in to the Emergency Department and triage nurse.  Should you have questions after your visit or need to cancel or reschedule your appointment, please contact [facility]  Dept: [phone]  and follow the prompts.  Office hours are 8:00 a.m. to 4:30 p.m. Monday - Friday. Please note that voicemails left after 4:00 p.m. may not be returned until the following business day.  We are closed weekends and major holidays. You have access to a nurse at all times for urgent questions. Please call the main number to the clinic Dept: [phone] and follow the prompts.   For any non-urgent questions, you may also contact your provider using MyChart. We now offer e-Visits for anyone 8 and older to request care online for non-urgent symptoms. For details visit [email or web address].   Also download the MyChart app! Go to the app store, search "MyChart", open the app, select [facility], and log in with your MyChart username and password.  Due to Covid, a mask is required upon entering the hospital/clinic. If you do not have a mask, one will be given to you upon arrival. For doctor visits, patients may have 1 support person aged 42 or older with them. For treatment visits, patients cannot have anyone with them due to current Covid guidelines and our immunocompromised population.   Cisplatin injection What is this medication? CISPLATIN (SIS pla tin) is a chemotherapy drug. It targets fast dividing cells, like cancer cells, and causes these cells to die. This medicine is used to treat many types of cancer like bladder, ovarian, and testicular cancers. This medicine may be  used for other purposes; ask your health care provider or pharmacist if you have questions. COMMON BRAND NAME(S): Platinol, Platinol -AQ What should I tell my care team before I take this medication? They need to know if you have any of these conditions: eye disease, vision problems hearing  problems kidney disease low blood counts, like white cells, platelets, or red blood cells tingling of the fingers or toes, or other nerve disorder an unusual or allergic reaction to cisplatin, carboplatin, oxaliplatin, other medicines, foods, dyes, or preservatives pregnant or trying to get pregnant breast-feeding How should I use this medication? This drug is given as an infusion into a vein. It is administered in a hospital or clinic by a specially trained health care professional. Talk to your pediatrician regarding the use of this medicine in children. Special care may be needed. Overdosage: If you think you have taken too much of this medicine contact a poison control center or emergency room at once. NOTE: This medicine is only for you. Do not share this medicine with others. What if I miss a dose? It is important not to miss a dose. Call your doctor or health care professional if you are unable to keep an appointment. What may interact with this medication? This medicine may interact with the following medications: foscarnet certain antibiotics like amikacin, gentamicin, neomycin, polymyxin B, streptomycin, tobramycin, vancomycin This list may not describe all possible interactions. Give your health care provider a list of all the medicines, herbs, non-prescription drugs, or dietary supplements you use. Also tell them if you smoke, drink alcohol, or use illegal drugs. Some items may interact with your medicine. What should I watch for while using this medication? Your condition will be monitored carefully while you are receiving this medicine. You will need important blood work done while you are taking this medicine. This drug may make you feel generally unwell. This is not uncommon, as chemotherapy can affect healthy cells as well as cancer cells. Report any side effects. Continue your course of treatment even though you feel ill unless your doctor tells you to stop. This medicine may  increase your risk of getting an infection. Call your healthcare professional for advice if you get a fever, chills, or sore throat, or other symptoms of a cold or flu. Do not treat yourself. Try to avoid being around people who are sick. Avoid taking medicines that contain aspirin, acetaminophen, ibuprofen, naproxen, or ketoprofen unless instructed by your healthcare professional. These medicines may hide a fever. This medicine may increase your risk to bruise or bleed. Call your doctor or health care professional if you notice any unusual bleeding. Be careful brushing and flossing your teeth or using a toothpick because you may get an infection or bleed more easily. If you have any dental work done, tell your dentist you are receiving this medicine. Do not become pregnant while taking this medicine or for 14 months after stopping it. Women should inform their healthcare professional if they wish to become pregnant or think they might be pregnant. Men should not father a child while taking this medicine and for 11 months after stopping it. There is potential for serious side effects to an unborn child. Talk to your healthcare professional for more information. Do not breast-feed an infant while taking this medicine. This medicine has caused ovarian failure in some women. This medicine may make it more difficult to get pregnant. Talk to your healthcare professional if you are concerned about your fertility. This medicine has  caused decreased sperm counts in some men. This may make it more difficult to father a child. Talk to your healthcare professional if you are concerned about your fertility. Drink fluids as directed while you are taking this medicine. This will help protect your kidneys. Call your doctor or health care professional if you get diarrhea. Do not treat yourself. What side effects may I notice from receiving this medication? Side effects that you should report to your doctor or health care  professional as soon as possible: allergic reactions like skin rash, itching or hives, swelling of the face, lips, or tongue blurred vision changes in vision decreased hearing or ringing of the ears nausea, vomiting pain, redness, or irritation at site where injected pain, tingling, numbness in the hands or feet signs and symptoms of bleeding such as bloody or black, tarry stools; red or dark brown urine; spitting up blood or brown material that looks like coffee grounds; red spots on the skin; unusual bruising or bleeding from the eyes, gums, or nose signs and symptoms of infection like fever; chills; cough; sore throat; pain or trouble passing urine signs and symptoms of kidney injury like trouble passing urine or change in the amount of urine signs and symptoms of low red blood cells or anemia such as unusually weak or tired; feeling faint or lightheaded; falls; breathing problems Side effects that usually do not require medical attention (report to your doctor or health care professional if they continue or are bothersome): loss of appetite mouth sores muscle cramps This list may not describe all possible side effects. Call your doctor for medical advice about side effects. You may report side effects to FDA at 1-800-FDA-1088. Where should I keep my medication? This drug is given in a hospital or clinic and will not be stored at home. NOTE: This sheet is a summary. It may not cover all possible information. If you have questions about this medicine, talk to your doctor, pharmacist, or health care provider.  2022 Elsevier/Gold Standard (2018-07-19 15:59:17)  Etoposide, VP-16 injection What is this medication? ETOPOSIDE, VP-16 (e toe POE side) is a chemotherapy drug. It is used to treat testicular cancer, lung cancer, and other cancers. This medicine may be used for other purposes; ask your health care provider or pharmacist if you have questions. COMMON BRAND NAME(S): Etopophos,  Toposar, VePesid What should I tell my care team before I take this medication? They need to know if you have any of these conditions: infection kidney disease liver disease low blood counts, like low white cell, platelet, or red cell counts an unusual or allergic reaction to etoposide, other medicines, foods, dyes, or preservatives pregnant or trying to get pregnant breast-feeding How should I use this medication? This medicine is for infusion into a vein. It is administered in a hospital or clinic by a specially trained health care professional. Talk to your pediatrician regarding the use of this medicine in children. Special care may be needed. Overdosage: If you think you have taken too much of this medicine contact a poison control center or emergency room at once. NOTE: This medicine is only for you. Do not share this medicine with others. What if I miss a dose? It is important not to miss your dose. Call your doctor or health care professional if you are unable to keep an appointment. What may interact with this medication? This medicine may interact with the following medications: warfarin This list may not describe all possible interactions. Give your health care  provider a list of all the medicines, herbs, non-prescription drugs, or dietary supplements you use. Also tell them if you smoke, drink alcohol, or use illegal drugs. Some items may interact with your medicine. What should I watch for while using this medication? Visit your doctor for checks on your progress. This drug may make you feel generally unwell. This is not uncommon, as chemotherapy can affect healthy cells as well as cancer cells. Report any side effects. Continue your course of treatment even though you feel ill unless your doctor tells you to stop. In some cases, you may be given additional medicines to help with side effects. Follow all directions for their use. Call your doctor or health care professional for  advice if you get a fever, chills or sore throat, or other symptoms of a cold or flu. Do not treat yourself. This drug decreases your body's ability to fight infections. Try to avoid being around people who are sick. This medicine may increase your risk to bruise or bleed. Call your doctor or health care professional if you notice any unusual bleeding. Talk to your doctor about your risk of cancer. You may be more at risk for certain types of cancers if you take this medicine. Do not become pregnant while taking this medicine or for at least 6 months after stopping it. Women should inform their doctor if they wish to become pregnant or think they might be pregnant. Women of child-bearing potential will need to have a negative pregnancy test before starting this medicine. There is a potential for serious side effects to an unborn child. Talk to your health care professional or pharmacist for more information. Do not breast-feed an infant while taking this medicine. Men must use a latex condom during sexual contact with a woman while taking this medicine and for at least 4 months after stopping it. A latex condom is needed even if you have had a vasectomy. Contact your doctor right away if your partner becomes pregnant. Do not donate sperm while taking this medicine and for at least 4 months after you stop taking this medicine. Men should inform their doctors if they wish to father a child. This medicine may lower sperm counts. What side effects may I notice from receiving this medication? Side effects that you should report to your doctor or health care professional as soon as possible: allergic reactions like skin rash, itching or hives, swelling of the face, lips, or tongue low blood counts - this medicine may decrease the number of white blood cells, red blood cells, and platelets. You may be at increased risk for infections and bleeding nausea, vomiting redness, blistering, peeling or loosening of the  skin, including inside the mouth signs and symptoms of infection like fever; chills; cough; sore throat; pain or trouble passing urine signs and symptoms of low red blood cells or anemia such as unusually weak or tired; feeling faint or lightheaded; falls; breathing problems unusual bruising or bleeding Side effects that usually do not require medical attention (report to your doctor or health care professional if they continue or are bothersome): changes in taste diarrhea hair loss loss of appetite mouth sores This list may not describe all possible side effects. Call your doctor for medical advice about side effects. You may report side effects to FDA at 1-800-FDA-1088. Where should I keep my medication? This drug is given in a hospital or clinic and will not be stored at home. NOTE: This sheet is a summary. It may not  cover all possible information. If you have questions about this medicine, talk to your doctor, pharmacist, or health care provider.  2022 Elsevier/Gold Standard (2018-09-18 16:57:15)

## 2021-04-26 ENCOUNTER — Encounter: Payer: Self-pay | Admitting: Oncology

## 2021-04-26 ENCOUNTER — Inpatient Hospital Stay: Payer: 59

## 2021-04-26 ENCOUNTER — Ambulatory Visit: Payer: PRIVATE HEALTH INSURANCE

## 2021-04-26 VITALS — BP 132/78 | HR 76 | Temp 97.9°F | Resp 18

## 2021-04-26 DIAGNOSIS — Z5111 Encounter for antineoplastic chemotherapy: Secondary | ICD-10-CM | POA: Diagnosis not present

## 2021-04-26 DIAGNOSIS — C6292 Malignant neoplasm of left testis, unspecified whether descended or undescended: Secondary | ICD-10-CM

## 2021-04-26 MED ORDER — SODIUM CHLORIDE 0.9 % IV SOLN: Freq: Once | INTRAVENOUS | Status: AC

## 2021-04-26 MED ORDER — SODIUM CHLORIDE 0.9 % IV SOLN
20.0000 mg/m2 | Freq: Once | INTRAVENOUS | Status: AC
  Administered 2021-04-26: 46 mg via INTRAVENOUS
  Filled 2021-04-26: qty 46

## 2021-04-26 MED ORDER — SODIUM CHLORIDE 0.9 % IV SOLN
100.0000 mg/m2 | Freq: Once | INTRAVENOUS | Status: AC
  Administered 2021-04-26: 230 mg via INTRAVENOUS
  Filled 2021-04-26: qty 11.5

## 2021-04-26 MED ORDER — MAGNESIUM SULFATE 2 GM/50ML IV SOLN
2.0000 g | Freq: Once | INTRAVENOUS | Status: AC
  Administered 2021-04-26: 2 g via INTRAVENOUS
  Filled 2021-04-26: qty 50

## 2021-04-26 MED ORDER — SODIUM CHLORIDE 0.9 % IV SOLN
10.0000 mg | Freq: Once | INTRAVENOUS | Status: AC
  Administered 2021-04-26: 10 mg via INTRAVENOUS
  Filled 2021-04-26: qty 10

## 2021-04-26 MED ORDER — POTASSIUM CHLORIDE IN NACL 20-0.9 MEQ/L-% IV SOLN
Freq: Once | INTRAVENOUS | Status: AC
  Filled 2021-04-26: qty 1000

## 2021-04-26 MED ORDER — SODIUM CHLORIDE 0.9 % IV SOLN
30.0000 [IU] | Freq: Once | INTRAVENOUS | Status: AC
  Administered 2021-04-26: 30 [IU] via INTRAVENOUS
  Filled 2021-04-26: qty 10

## 2021-04-26 MED FILL — Dexamethasone Sodium Phosphate Inj 100 MG/10ML: INTRAMUSCULAR | Qty: 1 | Status: AC

## 2021-04-26 MED FILL — Fosaprepitant Dimeglumine For IV Infusion 150 MG (Base Eq): INTRAVENOUS | Qty: 5 | Status: AC

## 2021-04-26 NOTE — Progress Notes (Signed)
Met w/ pt to introduce myself as her Arboriculturist.  Unfortunately there aren't any foundations offering copay assistance for his Dx and the type of ins he has.  I informed him of the [employer], went over what it covers and gave him the income requirement.  Pt stated he exceeds that amount so he doesn't qualify for the grant at this time.  He has my card for any questions or concerns he may have in the future.

## 2021-04-27 ENCOUNTER — Ambulatory Visit: Payer: PRIVATE HEALTH INSURANCE

## 2021-04-27 ENCOUNTER — Inpatient Hospital Stay: Payer: 59

## 2021-04-27 ENCOUNTER — Other Ambulatory Visit: Payer: Self-pay

## 2021-04-27 VITALS — BP 137/78 | HR 73 | Temp 97.8°F | Resp 20

## 2021-04-27 DIAGNOSIS — C6292 Malignant neoplasm of left testis, unspecified whether descended or undescended: Secondary | ICD-10-CM

## 2021-04-27 DIAGNOSIS — Z5111 Encounter for antineoplastic chemotherapy: Secondary | ICD-10-CM | POA: Diagnosis not present

## 2021-04-27 MED ORDER — SODIUM CHLORIDE 0.9 % IV SOLN: Freq: Once | INTRAVENOUS | Status: AC

## 2021-04-27 MED ORDER — SODIUM CHLORIDE 0.9 % IV SOLN
10.0000 mg | Freq: Once | INTRAVENOUS | Status: AC
  Administered 2021-04-27: 10 mg via INTRAVENOUS
  Filled 2021-04-27: qty 10

## 2021-04-27 MED ORDER — SODIUM CHLORIDE 0.9 % IV SOLN
20.0000 mg/m2 | Freq: Once | INTRAVENOUS | Status: AC
  Administered 2021-04-27: 46 mg via INTRAVENOUS
  Filled 2021-04-27: qty 46

## 2021-04-27 MED ORDER — SODIUM CHLORIDE 0.9 % IV SOLN
150.0000 mg | Freq: Once | INTRAVENOUS | Status: AC
  Administered 2021-04-27: 150 mg via INTRAVENOUS
  Filled 2021-04-27: qty 150

## 2021-04-27 MED ORDER — PALONOSETRON HCL INJECTION 0.25 MG/5ML
0.2500 mg | Freq: Once | INTRAVENOUS | Status: AC
  Administered 2021-04-27: 0.25 mg via INTRAVENOUS
  Filled 2021-04-27: qty 5

## 2021-04-27 MED ORDER — SODIUM CHLORIDE 0.9 % IV SOLN
100.0000 mg/m2 | Freq: Once | INTRAVENOUS | Status: AC
  Administered 2021-04-27: 230 mg via INTRAVENOUS
  Filled 2021-04-27: qty 11.5

## 2021-04-27 MED ORDER — MAGNESIUM SULFATE 2 GM/50ML IV SOLN
2.0000 g | Freq: Once | INTRAVENOUS | Status: AC
  Administered 2021-04-27: 2 g via INTRAVENOUS
  Filled 2021-04-27: qty 50

## 2021-04-27 MED ORDER — POTASSIUM CHLORIDE IN NACL 20-0.9 MEQ/L-% IV SOLN
Freq: Once | INTRAVENOUS | Status: AC
  Filled 2021-04-27: qty 1000

## 2021-04-27 MED FILL — Dexamethasone Sodium Phosphate Inj 100 MG/10ML: INTRAMUSCULAR | Qty: 1 | Status: AC

## 2021-04-27 NOTE — Patient Instructions (Signed)
[facility]  Discharge Instructions: Thank you for choosing [facility] to provide your oncology and hematology care.   If you have a lab appointment with the [facility], please go directly to the [facility] and check in at the registration area.   Wear comfortable clothing and clothing appropriate for easy access to any Portacath or PICC line.   We strive to give you quality time with your provider. You may need to reschedule your appointment if you arrive late (15 or more minutes).  Arriving late affects you and other patients whose appointments are after yours.  Also, if you miss three or more appointments without notifying the office, you may be dismissed from the clinic at the provider's discretion.      For prescription refill requests, have your pharmacy contact our office and allow 72 hours for refills to be completed.    Today you received the following chemotherapy and/or immunotherapy agents Cisplatin & Etoposide      To help prevent nausea and vomiting after your treatment, we encourage you to take your nausea medication as directed.  BELOW ARE SYMPTOMS THAT SHOULD BE REPORTED IMMEDIATELY: *FEVER GREATER THAN 100.4 F (38 C) OR HIGHER *CHILLS OR SWEATING *NAUSEA AND VOMITING THAT IS NOT CONTROLLED WITH YOUR NAUSEA MEDICATION *UNUSUAL SHORTNESS OF BREATH *UNUSUAL BRUISING OR BLEEDING *URINARY PROBLEMS (pain or burning when urinating, or frequent urination) *BOWEL PROBLEMS (unusual diarrhea, constipation, pain near the anus) TENDERNESS IN MOUTH AND THROAT WITH OR WITHOUT PRESENCE OF ULCERS (sore throat, sores in mouth, or a toothache) UNUSUAL RASH, SWELLING OR PAIN  UNUSUAL VAGINAL DISCHARGE OR ITCHING   Items with * indicate a potential emergency and should be followed up as soon as possible or go to the Emergency Department if any problems should occur.  Please show the CHEMOTHERAPY ALERT CARD or IMMUNOTHERAPY ALERT CARD at  check-in to the Emergency Department and triage nurse.  Should you have questions after your visit or need to cancel or reschedule your appointment, please contact [facility]  Dept: [phone]  and follow the prompts.  Office hours are 8:00 a.m. to 4:30 p.m. Monday - Friday. Please note that voicemails left after 4:00 p.m. may not be returned until the following business day.  We are closed weekends and major holidays. You have access to a nurse at all times for urgent questions. Please call the main number to the clinic Dept: [phone] and follow the prompts.   For any non-urgent questions, you may also contact your provider using MyChart. We now offer e-Visits for anyone 65 and older to request care online for non-urgent symptoms. For details visit [email or web address].   Also download the MyChart app! Go to the app store, search "MyChart", open the app, select [facility], and log in with your MyChart username and password.  Due to Covid, a mask is required upon entering the hospital/clinic. If you do not have a mask, one will be given to you upon arrival. For doctor visits, patients may have 1 support person aged 14 or older with them. For treatment visits, patients cannot have anyone with them due to current Covid guidelines and our immunocompromised population.   Cisplatin injection What is this medication? CISPLATIN (SIS pla tin) is a chemotherapy drug. It targets fast dividing cells, like cancer cells, and causes these cells to die. This medicine is used to treat many types of cancer like bladder, ovarian, and testicular cancers. This medicine may  be used for other purposes; ask your health care provider or pharmacist if you have questions. COMMON BRAND NAME(S): Platinol, Platinol -AQ What should I tell my care team before I take this medication? They need to know if you have any of these conditions: eye disease, vision problems hearing  problems kidney disease low blood counts, like white cells, platelets, or red blood cells tingling of the fingers or toes, or other nerve disorder an unusual or allergic reaction to cisplatin, carboplatin, oxaliplatin, other medicines, foods, dyes, or preservatives pregnant or trying to get pregnant breast-feeding How should I use this medication? This drug is given as an infusion into a vein. It is administered in a hospital or clinic by a specially trained health care professional. Talk to your pediatrician regarding the use of this medicine in children. Special care may be needed. Overdosage: If you think you have taken too much of this medicine contact a poison control center or emergency room at once. NOTE: This medicine is only for you. Do not share this medicine with others. What if I miss a dose? It is important not to miss a dose. Call your doctor or health care professional if you are unable to keep an appointment. What may interact with this medication? This medicine may interact with the following medications: foscarnet certain antibiotics like amikacin, gentamicin, neomycin, polymyxin B, streptomycin, tobramycin, vancomycin This list may not describe all possible interactions. Give your health care provider a list of all the medicines, herbs, non-prescription drugs, or dietary supplements you use. Also tell them if you smoke, drink alcohol, or use illegal drugs. Some items may interact with your medicine. What should I watch for while using this medication? Your condition will be monitored carefully while you are receiving this medicine. You will need important blood work done while you are taking this medicine. This drug may make you feel generally unwell. This is not uncommon, as chemotherapy can affect healthy cells as well as cancer cells. Report any side effects. Continue your course of treatment even though you feel ill unless your doctor tells you to stop. This medicine may  increase your risk of getting an infection. Call your healthcare professional for advice if you get a fever, chills, or sore throat, or other symptoms of a cold or flu. Do not treat yourself. Try to avoid being around people who are sick. Avoid taking medicines that contain aspirin, acetaminophen, ibuprofen, naproxen, or ketoprofen unless instructed by your healthcare professional. These medicines may hide a fever. This medicine may increase your risk to bruise or bleed. Call your doctor or health care professional if you notice any unusual bleeding. Be careful brushing and flossing your teeth or using a toothpick because you may get an infection or bleed more easily. If you have any dental work done, tell your dentist you are receiving this medicine. Do not become pregnant while taking this medicine or for 14 months after stopping it. Women should inform their healthcare professional if they wish to become pregnant or think they might be pregnant. Men should not father a child while taking this medicine and for 11 months after stopping it. There is potential for serious side effects to an unborn child. Talk to your healthcare professional for more information. Do not breast-feed an infant while taking this medicine. This medicine has caused ovarian failure in some women. This medicine may make it more difficult to get pregnant. Talk to your healthcare professional if you are concerned about your fertility. This medicine  has caused decreased sperm counts in some men. This may make it more difficult to father a child. Talk to your healthcare professional if you are concerned about your fertility. Drink fluids as directed while you are taking this medicine. This will help protect your kidneys. Call your doctor or health care professional if you get diarrhea. Do not treat yourself. What side effects may I notice from receiving this medication? Side effects that you should report to your doctor or health care  professional as soon as possible: allergic reactions like skin rash, itching or hives, swelling of the face, lips, or tongue blurred vision changes in vision decreased hearing or ringing of the ears nausea, vomiting pain, redness, or irritation at site where injected pain, tingling, numbness in the hands or feet signs and symptoms of bleeding such as bloody or black, tarry stools; red or dark brown urine; spitting up blood or brown material that looks like coffee grounds; red spots on the skin; unusual bruising or bleeding from the eyes, gums, or nose signs and symptoms of infection like fever; chills; cough; sore throat; pain or trouble passing urine signs and symptoms of kidney injury like trouble passing urine or change in the amount of urine signs and symptoms of low red blood cells or anemia such as unusually weak or tired; feeling faint or lightheaded; falls; breathing problems Side effects that usually do not require medical attention (report to your doctor or health care professional if they continue or are bothersome): loss of appetite mouth sores muscle cramps This list may not describe all possible side effects. Call your doctor for medical advice about side effects. You may report side effects to FDA at 1-800-FDA-1088. Where should I keep my medication? This drug is given in a hospital or clinic and will not be stored at home. NOTE: This sheet is a summary. It may not cover all possible information. If you have questions about this medicine, talk to your doctor, pharmacist, or health care provider.  2022 Elsevier/Gold Standard (2018-07-19 15:59:17)  Etoposide, VP-16 injection What is this medication? ETOPOSIDE, VP-16 (e toe POE side) is a chemotherapy drug. It is used to treat testicular cancer, lung cancer, and other cancers. This medicine may be used for other purposes; ask your health care provider or pharmacist if you have questions. COMMON BRAND NAME(S): Etopophos,  Toposar, VePesid What should I tell my care team before I take this medication? They need to know if you have any of these conditions: infection kidney disease liver disease low blood counts, like low white cell, platelet, or red cell counts an unusual or allergic reaction to etoposide, other medicines, foods, dyes, or preservatives pregnant or trying to get pregnant breast-feeding How should I use this medication? This medicine is for infusion into a vein. It is administered in a hospital or clinic by a specially trained health care professional. Talk to your pediatrician regarding the use of this medicine in children. Special care may be needed. Overdosage: If you think you have taken too much of this medicine contact a poison control center or emergency room at once. NOTE: This medicine is only for you. Do not share this medicine with others. What if I miss a dose? It is important not to miss your dose. Call your doctor or health care professional if you are unable to keep an appointment. What may interact with this medication? This medicine may interact with the following medications: warfarin This list may not describe all possible interactions. Give your health  care provider a list of all the medicines, herbs, non-prescription drugs, or dietary supplements you use. Also tell them if you smoke, drink alcohol, or use illegal drugs. Some items may interact with your medicine. What should I watch for while using this medication? Visit your doctor for checks on your progress. This drug may make you feel generally unwell. This is not uncommon, as chemotherapy can affect healthy cells as well as cancer cells. Report any side effects. Continue your course of treatment even though you feel ill unless your doctor tells you to stop. In some cases, you may be given additional medicines to help with side effects. Follow all directions for their use. Call your doctor or health care professional for  advice if you get a fever, chills or sore throat, or other symptoms of a cold or flu. Do not treat yourself. This drug decreases your body's ability to fight infections. Try to avoid being around people who are sick. This medicine may increase your risk to bruise or bleed. Call your doctor or health care professional if you notice any unusual bleeding. Talk to your doctor about your risk of cancer. You may be more at risk for certain types of cancers if you take this medicine. Do not become pregnant while taking this medicine or for at least 6 months after stopping it. Women should inform their doctor if they wish to become pregnant or think they might be pregnant. Women of child-bearing potential will need to have a negative pregnancy test before starting this medicine. There is a potential for serious side effects to an unborn child. Talk to your health care professional or pharmacist for more information. Do not breast-feed an infant while taking this medicine. Men must use a latex condom during sexual contact with a woman while taking this medicine and for at least 4 months after stopping it. A latex condom is needed even if you have had a vasectomy. Contact your doctor right away if your partner becomes pregnant. Do not donate sperm while taking this medicine and for at least 4 months after you stop taking this medicine. Men should inform their doctors if they wish to father a child. This medicine may lower sperm counts. What side effects may I notice from receiving this medication? Side effects that you should report to your doctor or health care professional as soon as possible: allergic reactions like skin rash, itching or hives, swelling of the face, lips, or tongue low blood counts - this medicine may decrease the number of white blood cells, red blood cells, and platelets. You may be at increased risk for infections and bleeding nausea, vomiting redness, blistering, peeling or loosening of the  skin, including inside the mouth signs and symptoms of infection like fever; chills; cough; sore throat; pain or trouble passing urine signs and symptoms of low red blood cells or anemia such as unusually weak or tired; feeling faint or lightheaded; falls; breathing problems unusual bruising or bleeding Side effects that usually do not require medical attention (report to your doctor or health care professional if they continue or are bothersome): changes in taste diarrhea hair loss loss of appetite mouth sores This list may not describe all possible side effects. Call your doctor for medical advice about side effects. You may report side effects to FDA at 1-800-FDA-1088. Where should I keep my medication? This drug is given in a hospital or clinic and will not be stored at home. NOTE: This sheet is a summary. It may  not cover all possible information. If you have questions about this medicine, talk to your doctor, pharmacist, or health care provider.  2022 Elsevier/Gold Standard (2018-09-18 16:57:15)

## 2021-04-28 ENCOUNTER — Ambulatory Visit: Payer: PRIVATE HEALTH INSURANCE

## 2021-04-28 ENCOUNTER — Inpatient Hospital Stay: Payer: 59

## 2021-04-28 VITALS — BP 144/78 | HR 67 | Temp 98.8°F | Resp 18

## 2021-04-28 DIAGNOSIS — C6292 Malignant neoplasm of left testis, unspecified whether descended or undescended: Secondary | ICD-10-CM

## 2021-04-28 DIAGNOSIS — Z5111 Encounter for antineoplastic chemotherapy: Secondary | ICD-10-CM | POA: Diagnosis not present

## 2021-04-28 MED ORDER — SODIUM CHLORIDE 0.9 % IV SOLN
20.0000 mg/m2 | Freq: Once | INTRAVENOUS | Status: AC
  Administered 2021-04-28: 46 mg via INTRAVENOUS
  Filled 2021-04-28: qty 46

## 2021-04-28 MED ORDER — DEXAMETHASONE SODIUM PHOSPHATE 100 MG/10ML IJ SOLN
10.0000 mg | Freq: Once | INTRAMUSCULAR | Status: AC
  Administered 2021-04-28: 10 mg via INTRAVENOUS
  Filled 2021-04-28: qty 10

## 2021-04-28 MED ORDER — SODIUM CHLORIDE 0.9 % IV SOLN
100.0000 mg/m2 | Freq: Once | INTRAVENOUS | Status: AC
  Administered 2021-04-28: 230 mg via INTRAVENOUS
  Filled 2021-04-28: qty 11.5

## 2021-04-28 MED ORDER — SODIUM CHLORIDE 0.9 % IV SOLN
Freq: Once | INTRAVENOUS | Status: AC
Start: 2021-04-28 — End: 2021-04-28

## 2021-04-28 MED ORDER — POTASSIUM CHLORIDE IN NACL 20-0.9 MEQ/L-% IV SOLN
Freq: Once | INTRAVENOUS | Status: AC
  Filled 2021-04-28: qty 1000

## 2021-04-28 MED ORDER — MAGNESIUM SULFATE 2 GM/50ML IV SOLN
2.0000 g | Freq: Once | INTRAVENOUS | Status: AC
  Administered 2021-04-28: 2 g via INTRAVENOUS
  Filled 2021-04-28: qty 50

## 2021-04-28 MED FILL — Fosaprepitant Dimeglumine For IV Infusion 150 MG (Base Eq): INTRAVENOUS | Qty: 5 | Status: AC

## 2021-04-28 MED FILL — Dexamethasone Sodium Phosphate Inj 100 MG/10ML: INTRAMUSCULAR | Qty: 1 | Status: AC

## 2021-04-28 NOTE — Patient Instructions (Signed)
[facility]  Discharge Instructions: Thank you for choosing [facility] to provide your oncology and hematology care.   If you have a lab appointment with the [facility], please go directly to the [facility] and check in at the registration area.   Wear comfortable clothing and clothing appropriate for easy access to any Portacath or PICC line.   We strive to give you quality time with your provider. You may need to reschedule your appointment if you arrive late (15 or more minutes).  Arriving late affects you and other patients whose appointments are after yours.  Also, if you miss three or more appointments without notifying the office, you may be dismissed from the clinic at the provider's discretion.      For prescription refill requests, have your pharmacy contact our office and allow 72 hours for refills to be completed.    Today you received the following chemotherapy and/or immunotherapy agents: Cisplatin/Etoposide.       To help prevent nausea and vomiting after your treatment, we encourage you to take your nausea medication as directed.  BELOW ARE SYMPTOMS THAT SHOULD BE REPORTED IMMEDIATELY: *FEVER GREATER THAN 100.4 F (38 C) OR HIGHER *CHILLS OR SWEATING *NAUSEA AND VOMITING THAT IS NOT CONTROLLED WITH YOUR NAUSEA MEDICATION *UNUSUAL SHORTNESS OF BREATH *UNUSUAL BRUISING OR BLEEDING *URINARY PROBLEMS (pain or burning when urinating, or frequent urination) *BOWEL PROBLEMS (unusual diarrhea, constipation, pain near the anus) TENDERNESS IN MOUTH AND THROAT WITH OR WITHOUT PRESENCE OF ULCERS (sore throat, sores in mouth, or a toothache) UNUSUAL RASH, SWELLING OR PAIN  UNUSUAL VAGINAL DISCHARGE OR ITCHING   Items with * indicate a potential emergency and should be followed up as soon as possible or go to the Emergency Department if any problems should occur.  Please show the CHEMOTHERAPY ALERT CARD or IMMUNOTHERAPY ALERT CARD at  check-in to the Emergency Department and triage nurse.  Should you have questions after your visit or need to cancel or reschedule your appointment, please contact [facility]  Dept: [phone]  and follow the prompts.  Office hours are 8:00 a.m. to 4:30 p.m. Monday - Friday. Please note that voicemails left after 4:00 p.m. may not be returned until the following business day.  We are closed weekends and major holidays. You have access to a nurse at all times for urgent questions. Please call the main number to the clinic Dept: [phone] and follow the prompts.   For any non-urgent questions, you may also contact your provider using MyChart. We now offer e-Visits for anyone 35 and older to request care online for non-urgent symptoms. For details visit [email or web address].   Also download the MyChart app! Go to the app store, search "MyChart", open the app, select [facility], and log in with your MyChart username and password.  Due to Covid, a mask is required upon entering the hospital/clinic. If you do not have a mask, one will be given to you upon arrival. For doctor visits, patients may have 1 support person aged 58 or older with them. For treatment visits, patients cannot have anyone with them due to current Covid guidelines and our immunocompromised population.   Cisplatin injection What is this medication? CISPLATIN (SIS pla tin) is a chemotherapy drug. It targets fast dividing cells, like cancer cells, and causes these cells to die. This medicine is used to treat many types of cancer like bladder, ovarian, and testicular cancers. This medicine may be  used for other purposes; ask your health care provider or pharmacist if you have questions. COMMON BRAND NAME(S): Platinol, Platinol -AQ What should I tell my care team before I take this medication? They need to know if you have any of these conditions: eye disease, vision problems hearing  problems kidney disease low blood counts, like white cells, platelets, or red blood cells tingling of the fingers or toes, or other nerve disorder an unusual or allergic reaction to cisplatin, carboplatin, oxaliplatin, other medicines, foods, dyes, or preservatives pregnant or trying to get pregnant breast-feeding How should I use this medication? This drug is given as an infusion into a vein. It is administered in a hospital or clinic by a specially trained health care professional. Talk to your pediatrician regarding the use of this medicine in children. Special care may be needed. Overdosage: If you think you have taken too much of this medicine contact a poison control center or emergency room at once. NOTE: This medicine is only for you. Do not share this medicine with others. What if I miss a dose? It is important not to miss a dose. Call your doctor or health care professional if you are unable to keep an appointment. What may interact with this medication? This medicine may interact with the following medications: foscarnet certain antibiotics like amikacin, gentamicin, neomycin, polymyxin B, streptomycin, tobramycin, vancomycin This list may not describe all possible interactions. Give your health care provider a list of all the medicines, herbs, non-prescription drugs, or dietary supplements you use. Also tell them if you smoke, drink alcohol, or use illegal drugs. Some items may interact with your medicine. What should I watch for while using this medication? Your condition will be monitored carefully while you are receiving this medicine. You will need important blood work done while you are taking this medicine. This drug may make you feel generally unwell. This is not uncommon, as chemotherapy can affect healthy cells as well as cancer cells. Report any side effects. Continue your course of treatment even though you feel ill unless your doctor tells you to stop. This medicine may  increase your risk of getting an infection. Call your healthcare professional for advice if you get a fever, chills, or sore throat, or other symptoms of a cold or flu. Do not treat yourself. Try to avoid being around people who are sick. Avoid taking medicines that contain aspirin, acetaminophen, ibuprofen, naproxen, or ketoprofen unless instructed by your healthcare professional. These medicines may hide a fever. This medicine may increase your risk to bruise or bleed. Call your doctor or health care professional if you notice any unusual bleeding. Be careful brushing and flossing your teeth or using a toothpick because you may get an infection or bleed more easily. If you have any dental work done, tell your dentist you are receiving this medicine. Do not become pregnant while taking this medicine or for 14 months after stopping it. Women should inform their healthcare professional if they wish to become pregnant or think they might be pregnant. Men should not father a child while taking this medicine and for 11 months after stopping it. There is potential for serious side effects to an unborn child. Talk to your healthcare professional for more information. Do not breast-feed an infant while taking this medicine. This medicine has caused ovarian failure in some women. This medicine may make it more difficult to get pregnant. Talk to your healthcare professional if you are concerned about your fertility. This medicine has  caused decreased sperm counts in some men. This may make it more difficult to father a child. Talk to your healthcare professional if you are concerned about your fertility. Drink fluids as directed while you are taking this medicine. This will help protect your kidneys. Call your doctor or health care professional if you get diarrhea. Do not treat yourself. What side effects may I notice from receiving this medication? Side effects that you should report to your doctor or health care  professional as soon as possible: allergic reactions like skin rash, itching or hives, swelling of the face, lips, or tongue blurred vision changes in vision decreased hearing or ringing of the ears nausea, vomiting pain, redness, or irritation at site where injected pain, tingling, numbness in the hands or feet signs and symptoms of bleeding such as bloody or black, tarry stools; red or dark brown urine; spitting up blood or brown material that looks like coffee grounds; red spots on the skin; unusual bruising or bleeding from the eyes, gums, or nose signs and symptoms of infection like fever; chills; cough; sore throat; pain or trouble passing urine signs and symptoms of kidney injury like trouble passing urine or change in the amount of urine signs and symptoms of low red blood cells or anemia such as unusually weak or tired; feeling faint or lightheaded; falls; breathing problems Side effects that usually do not require medical attention (report to your doctor or health care professional if they continue or are bothersome): loss of appetite mouth sores muscle cramps This list may not describe all possible side effects. Call your doctor for medical advice about side effects. You may report side effects to FDA at 1-800-FDA-1088. Where should I keep my medication? This drug is given in a hospital or clinic and will not be stored at home. NOTE: This sheet is a summary. It may not cover all possible information. If you have questions about this medicine, talk to your doctor, pharmacist, or health care provider.  2022 Elsevier/Gold Standard (2018-07-19 15:59:17)  Etoposide, VP-16 injection What is this medication? ETOPOSIDE, VP-16 (e toe POE side) is a chemotherapy drug. It is used to treat testicular cancer, lung cancer, and other cancers. This medicine may be used for other purposes; ask your health care provider or pharmacist if you have questions. COMMON BRAND NAME(S): Etopophos,  Toposar, VePesid What should I tell my care team before I take this medication? They need to know if you have any of these conditions: infection kidney disease liver disease low blood counts, like low white cell, platelet, or red cell counts an unusual or allergic reaction to etoposide, other medicines, foods, dyes, or preservatives pregnant or trying to get pregnant breast-feeding How should I use this medication? This medicine is for infusion into a vein. It is administered in a hospital or clinic by a specially trained health care professional. Talk to your pediatrician regarding the use of this medicine in children. Special care may be needed. Overdosage: If you think you have taken too much of this medicine contact a poison control center or emergency room at once. NOTE: This medicine is only for you. Do not share this medicine with others. What if I miss a dose? It is important not to miss your dose. Call your doctor or health care professional if you are unable to keep an appointment. What may interact with this medication? This medicine may interact with the following medications: warfarin This list may not describe all possible interactions. Give your health care  provider a list of all the medicines, herbs, non-prescription drugs, or dietary supplements you use. Also tell them if you smoke, drink alcohol, or use illegal drugs. Some items may interact with your medicine. What should I watch for while using this medication? Visit your doctor for checks on your progress. This drug may make you feel generally unwell. This is not uncommon, as chemotherapy can affect healthy cells as well as cancer cells. Report any side effects. Continue your course of treatment even though you feel ill unless your doctor tells you to stop. In some cases, you may be given additional medicines to help with side effects. Follow all directions for their use. Call your doctor or health care professional for  advice if you get a fever, chills or sore throat, or other symptoms of a cold or flu. Do not treat yourself. This drug decreases your body's ability to fight infections. Try to avoid being around people who are sick. This medicine may increase your risk to bruise or bleed. Call your doctor or health care professional if you notice any unusual bleeding. Talk to your doctor about your risk of cancer. You may be more at risk for certain types of cancers if you take this medicine. Do not become pregnant while taking this medicine or for at least 6 months after stopping it. Women should inform their doctor if they wish to become pregnant or think they might be pregnant. Women of child-bearing potential will need to have a negative pregnancy test before starting this medicine. There is a potential for serious side effects to an unborn child. Talk to your health care professional or pharmacist for more information. Do not breast-feed an infant while taking this medicine. Men must use a latex condom during sexual contact with a woman while taking this medicine and for at least 4 months after stopping it. A latex condom is needed even if you have had a vasectomy. Contact your doctor right away if your partner becomes pregnant. Do not donate sperm while taking this medicine and for at least 4 months after you stop taking this medicine. Men should inform their doctors if they wish to father a child. This medicine may lower sperm counts. What side effects may I notice from receiving this medication? Side effects that you should report to your doctor or health care professional as soon as possible: allergic reactions like skin rash, itching or hives, swelling of the face, lips, or tongue low blood counts - this medicine may decrease the number of white blood cells, red blood cells, and platelets. You may be at increased risk for infections and bleeding nausea, vomiting redness, blistering, peeling or loosening of the  skin, including inside the mouth signs and symptoms of infection like fever; chills; cough; sore throat; pain or trouble passing urine signs and symptoms of low red blood cells or anemia such as unusually weak or tired; feeling faint or lightheaded; falls; breathing problems unusual bruising or bleeding Side effects that usually do not require medical attention (report to your doctor or health care professional if they continue or are bothersome): changes in taste diarrhea hair loss loss of appetite mouth sores This list may not describe all possible side effects. Call your doctor for medical advice about side effects. You may report side effects to FDA at 1-800-FDA-1088. Where should I keep my medication? This drug is given in a hospital or clinic and will not be stored at home. NOTE: This sheet is a summary. It may not  cover all possible information. If you have questions about this medicine, talk to your doctor, pharmacist, or health care provider.  2022 Elsevier/Gold Standard (2018-09-18 16:57:15)

## 2021-04-29 ENCOUNTER — Inpatient Hospital Stay: Payer: 59

## 2021-04-29 ENCOUNTER — Other Ambulatory Visit: Payer: Self-pay

## 2021-04-29 ENCOUNTER — Ambulatory Visit: Payer: PRIVATE HEALTH INSURANCE

## 2021-04-29 VITALS — BP 153/78 | HR 80 | Temp 97.8°F | Resp 18

## 2021-04-29 DIAGNOSIS — Z5111 Encounter for antineoplastic chemotherapy: Secondary | ICD-10-CM | POA: Diagnosis not present

## 2021-04-29 DIAGNOSIS — C6292 Malignant neoplasm of left testis, unspecified whether descended or undescended: Secondary | ICD-10-CM

## 2021-04-29 MED ORDER — PALONOSETRON HCL INJECTION 0.25 MG/5ML
0.2500 mg | Freq: Once | INTRAVENOUS | Status: AC
  Administered 2021-04-29: 0.25 mg via INTRAVENOUS
  Filled 2021-04-29: qty 5

## 2021-04-29 MED ORDER — SODIUM CHLORIDE 0.9 % IV SOLN
150.0000 mg | Freq: Once | INTRAVENOUS | Status: AC
  Administered 2021-04-29: 150 mg via INTRAVENOUS
  Filled 2021-04-29: qty 150

## 2021-04-29 MED ORDER — SODIUM CHLORIDE 0.9 % IV SOLN
20.0000 mg/m2 | Freq: Once | INTRAVENOUS | Status: AC
  Administered 2021-04-29: 46 mg via INTRAVENOUS
  Filled 2021-04-29: qty 46

## 2021-04-29 MED ORDER — SODIUM CHLORIDE 0.9 % IV SOLN
10.0000 mg | Freq: Once | INTRAVENOUS | Status: AC
  Administered 2021-04-29: 10 mg via INTRAVENOUS
  Filled 2021-04-29: qty 10

## 2021-04-29 MED ORDER — POTASSIUM CHLORIDE IN NACL 20-0.9 MEQ/L-% IV SOLN
Freq: Once | INTRAVENOUS | Status: AC
  Filled 2021-04-29: qty 1000

## 2021-04-29 MED ORDER — MAGNESIUM SULFATE 2 GM/50ML IV SOLN
2.0000 g | Freq: Once | INTRAVENOUS | Status: AC
  Administered 2021-04-29: 2 g via INTRAVENOUS
  Filled 2021-04-29: qty 50

## 2021-04-29 MED ORDER — SODIUM CHLORIDE 0.9 % IV SOLN
100.0000 mg/m2 | Freq: Once | INTRAVENOUS | Status: AC
  Administered 2021-04-29: 230 mg via INTRAVENOUS
  Filled 2021-04-29: qty 11.5

## 2021-04-29 MED ORDER — SODIUM CHLORIDE 0.9 % IV SOLN: Freq: Once | INTRAVENOUS | Status: AC

## 2021-04-29 NOTE — Patient Instructions (Signed)
[facility]  Discharge Instructions: °Thank you for choosing [facility] to provide your oncology and hematology care.  ° °If you have a lab appointment with the Cancer Center, please go directly to the Cancer Center and check in at the registration area. °  °Wear comfortable clothing and clothing appropriate for easy access to any Portacath or PICC line.  ° °We strive to give you quality time with your provider. You may need to reschedule your appointment if you arrive late (15 or more minutes).  Arriving late affects you and other patients whose appointments are after yours.  Also, if you miss three or more appointments without notifying the office, you may be dismissed from the clinic at the provider’s discretion.    °  °For prescription refill requests, have your pharmacy contact our office and allow 72 hours for refills to be completed.   ° °Today you received the following chemotherapy and/or immunotherapy agents: Cisplatin and Etoposide    °  °To help prevent nausea and vomiting after your treatment, we encourage you to take your nausea medication as directed. ° °BELOW ARE SYMPTOMS THAT SHOULD BE REPORTED IMMEDIATELY: °*FEVER GREATER THAN 100.4 F (38 °C) OR HIGHER °*CHILLS OR SWEATING °*NAUSEA AND VOMITING THAT IS NOT CONTROLLED WITH YOUR NAUSEA MEDICATION °*UNUSUAL SHORTNESS OF BREATH °*UNUSUAL BRUISING OR BLEEDING °*URINARY PROBLEMS (pain or burning when urinating, or frequent urination) °*BOWEL PROBLEMS (unusual diarrhea, constipation, pain near the anus) °TENDERNESS IN MOUTH AND THROAT WITH OR WITHOUT PRESENCE OF ULCERS (sore throat, sores in mouth, or a toothache) °UNUSUAL RASH, SWELLING OR PAIN  °UNUSUAL VAGINAL DISCHARGE OR ITCHING  ° °Items with * indicate a potential emergency and should be followed up as soon as possible or go to the Emergency Department if any problems should occur. ° °Please show the CHEMOTHERAPY ALERT CARD or IMMUNOTHERAPY ALERT CARD  at check-in to the Emergency Department and triage nurse. ° °Should you have questions after your visit or need to cancel or reschedule your appointment, please contact [facility]  Dept: [phone]  and follow the prompts.  Office hours are 8:00 a.m. to 4:30 p.m. Monday - Friday. Please note that voicemails left after 4:00 p.m. may not be returned until the following business day.  We are closed weekends and major holidays. You have access to a nurse at all times for urgent questions. Please call the main number to the clinic Dept: [phone] and follow the prompts. ° ° °For any non-urgent questions, you may also contact your provider using MyChart. We now offer e-Visits for anyone 18 and older to request care online for non-urgent symptoms. For details visit [email or web address]. °  °Also download the MyChart app! Go to the app store, search "MyChart", open the app, select [facility], and log in with your MyChart username and password. ° °Due to Covid, a mask is required upon entering the hospital/clinic. If you do not have a mask, one will be given to you upon arrival. For doctor visits, patients may have 1 support person aged 18 or older with them. For treatment visits, patients cannot have anyone with them due to current Covid guidelines and our immunocompromised population.  ° °

## 2021-05-03 ENCOUNTER — Inpatient Hospital Stay: Payer: 59

## 2021-05-03 ENCOUNTER — Other Ambulatory Visit: Payer: Self-pay

## 2021-05-03 ENCOUNTER — Inpatient Hospital Stay: Payer: 59 | Admitting: Oncology

## 2021-05-03 VITALS — BP 124/76 | HR 79 | Temp 97.9°F | Resp 18 | Ht 69.0 in | Wt 238.1 lb

## 2021-05-03 DIAGNOSIS — C6292 Malignant neoplasm of left testis, unspecified whether descended or undescended: Secondary | ICD-10-CM | POA: Diagnosis not present

## 2021-05-03 DIAGNOSIS — Z5111 Encounter for antineoplastic chemotherapy: Secondary | ICD-10-CM | POA: Diagnosis not present

## 2021-05-03 LAB — CBC WITH DIFFERENTIAL (CANCER CENTER ONLY)
Abs Immature Granulocytes: 0.11 10*3/uL — ABNORMAL HIGH (ref 0.00–0.07)
Basophils Absolute: 0 10*3/uL (ref 0.0–0.1)
Basophils Relative: 0 %
Eosinophils Absolute: 0 10*3/uL (ref 0.0–0.5)
Eosinophils Relative: 0 %
HCT: 37.6 % — ABNORMAL LOW (ref 39.0–52.0)
Hemoglobin: 13.7 g/dL (ref 13.0–17.0)
Immature Granulocytes: 2 %
Lymphocytes Relative: 12 %
Lymphs Abs: 0.9 10*3/uL (ref 0.7–4.0)
MCH: 32 pg (ref 26.0–34.0)
MCHC: 36.4 g/dL — ABNORMAL HIGH (ref 30.0–36.0)
MCV: 87.9 fL (ref 80.0–100.0)
Monocytes Absolute: 0.1 10*3/uL (ref 0.1–1.0)
Monocytes Relative: 1 %
Neutro Abs: 6.4 10*3/uL (ref 1.7–7.7)
Neutrophils Relative %: 85 %
Platelet Count: 172 10*3/uL (ref 150–400)
RBC: 4.28 MIL/uL (ref 4.22–5.81)
RDW: 12.3 % (ref 11.5–15.5)
WBC Count: 7.5 10*3/uL (ref 4.0–10.5)
nRBC: 0 % (ref 0.0–0.2)

## 2021-05-03 LAB — CMP (CANCER CENTER ONLY)
ALT: 42 U/L (ref 0–44)
AST: 16 U/L (ref 15–41)
Albumin: 3.3 g/dL — ABNORMAL LOW (ref 3.5–5.0)
Alkaline Phosphatase: 75 U/L (ref 38–126)
Anion gap: 12 (ref 5–15)
BUN: 20 mg/dL (ref 6–20)
CO2: 24 mmol/L (ref 22–32)
Calcium: 8.7 mg/dL — ABNORMAL LOW (ref 8.9–10.3)
Chloride: 96 mmol/L — ABNORMAL LOW (ref 98–111)
Creatinine: 0.92 mg/dL (ref 0.61–1.24)
GFR, Estimated: >60 mL/min (ref >60)
Glucose, Bld: 189 mg/dL — ABNORMAL HIGH (ref 70–99)
Potassium: 4.1 mmol/L (ref 3.5–5.1)
Sodium: 132 mmol/L — ABNORMAL LOW (ref 135–145)
Total Bilirubin: 1.1 mg/dL (ref 0.3–1.2)
Total Protein: 6.3 g/dL — ABNORMAL LOW (ref 6.5–8.1)

## 2021-05-03 MED ORDER — SODIUM CHLORIDE 0.9 % IV SOLN
30.0000 [IU] | Freq: Once | INTRAVENOUS | Status: AC
  Administered 2021-05-03: 30 [IU] via INTRAVENOUS
  Filled 2021-05-03: qty 10

## 2021-05-03 MED ORDER — HEPARIN SOD (PORK) LOCK FLUSH 100 UNIT/ML IV SOLN: 500.0000 [IU] | Freq: Once | INTRAVENOUS | Status: DC | PRN

## 2021-05-03 MED ORDER — SODIUM CHLORIDE 0.9% FLUSH: 10.0000 mL | INTRAVENOUS | Status: DC | PRN

## 2021-05-03 MED ORDER — PROCHLORPERAZINE MALEATE 10 MG PO TABS
10.0000 mg | ORAL_TABLET | Freq: Once | ORAL | Status: AC
  Administered 2021-05-03: 10 mg via ORAL
  Filled 2021-05-03: qty 1

## 2021-05-03 MED ORDER — SODIUM CHLORIDE 0.9 % IV SOLN: Freq: Once | INTRAVENOUS | Status: AC

## 2021-05-03 NOTE — Patient Instructions (Signed)
[facility]  Discharge Instructions: Thank you for choosing [facility] to provide your oncology and hematology care.   If you have a lab appointment with the [facility], please go directly to the [facility] and check in at the registration area.   Wear comfortable clothing and clothing appropriate for easy access to any Portacath or PICC line.   We strive to give you quality time with your provider. You may need to reschedule your appointment if you arrive late (15 or more minutes).  Arriving late affects you and other patients whose appointments are after yours.  Also, if you miss three or more appointments without notifying the office, you may be dismissed from the clinic at the provider's discretion.      For prescription refill requests, have your pharmacy contact our office and allow 72 hours for refills to be completed.    Today you received the following chemotherapy and/or immunotherapy agents Bleomycin       To help prevent nausea and vomiting after your treatment, we encourage you to take your nausea medication as directed.  BELOW ARE SYMPTOMS THAT SHOULD BE REPORTED IMMEDIATELY: *FEVER GREATER THAN 100.4 F (38 C) OR HIGHER *CHILLS OR SWEATING *NAUSEA AND VOMITING THAT IS NOT CONTROLLED WITH YOUR NAUSEA MEDICATION *UNUSUAL SHORTNESS OF BREATH *UNUSUAL BRUISING OR BLEEDING *URINARY PROBLEMS (pain or burning when urinating, or frequent urination) *BOWEL PROBLEMS (unusual diarrhea, constipation, pain near the anus) TENDERNESS IN MOUTH AND THROAT WITH OR WITHOUT PRESENCE OF ULCERS (sore throat, sores in mouth, or a toothache) UNUSUAL RASH, SWELLING OR PAIN  UNUSUAL VAGINAL DISCHARGE OR ITCHING   Items with * indicate a potential emergency and should be followed up as soon as possible or go to the Emergency Department if any problems should occur.  Please show the CHEMOTHERAPY ALERT CARD or IMMUNOTHERAPY ALERT CARD at check-in to  the Emergency Department and triage nurse.  Should you have questions after your visit or need to cancel or reschedule your appointment, please contact [facility]  Dept: [phone]  and follow the prompts.  Office hours are 8:00 a.m. to 4:30 p.m. Monday - Friday. Please note that voicemails left after 4:00 p.m. may not be returned until the following business day.  We are closed weekends and major holidays. You have access to a nurse at all times for urgent questions. Please call the main number to the clinic Dept: [phone] and follow the prompts.   For any non-urgent questions, you may also contact your provider using MyChart. We now offer e-Visits for anyone 41 and older to request care online for non-urgent symptoms. For details visit [email or web address].   Also download the MyChart app! Go to the app store, search "MyChart", open the app, select [facility], and log in with your MyChart username and password.  Due to Covid, a mask is required upon entering the hospital/clinic. If you do not have a mask, one will be given to you upon arrival. For doctor visits, patients may have 1 support person aged 53 or older with them. For treatment visits, patients cannot have anyone with them due to current Covid guidelines and our immunocompromised population.

## 2021-05-03 NOTE — Progress Notes (Signed)
Hematology and Oncology Follow Up Visit  Leonard Garrett [id number] 1965/11/30 55 y.o. 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  He was found to have T2N0 nonseminomatous tumor with 50% embryonal component.   Prior Therapy: He underwent left orchiectomy on February 18, 2021.  The final pathology showed a 6.2 cm tumor that is 50% embryonal, 40% yolk sac and 10% teratoma.  Final pathological staging was T2 disease.   His a post orchiectomy alpha-fetoprotein was 4.4 with beta-hCG less than 1.  CT scan on February 25, 2020 showed no evidence of metastatic disease although there is a mildly enlarged right external iliac lymph node measuring 1.1 cm.  Current therapy: BEP chemotherapy started on 04/25/2021.  He is here for day 9 of the current cycle.  Interim History: [name] returns today for a follow-up visit.  Since the last visit, he completed day 1 through day 5 of 5 BEP chemotherapy without any major complaints.  He does report some fatigue and some tiredness and occasional diarrhea but no other complaints.  He denies any nausea, vomiting or abdominal pain.  He denies any worsening neuropathy or shortness of breath.  Denies any cough or wheezing.     Medications: I have reviewed the patient's current medications.  Current Outpatient Medications  Medication Sig Dispense Refill   amLODipine (NORVASC) 5 MG tablet TAKE 1 TABLET(5 MG) BY MOUTH DAILY (Patient taking differently: Take 5 mg by mouth daily.) 90 tablet 3   aspirin 81 MG tablet Take 81 mg by mouth daily.     HYDROcodone-acetaminophen (NORCO/VICODIN) 5-325 MG tablet Take 1 tablet by mouth every 4 (four) hours as needed for moderate pain. 20 tablet 0   irbesartan (AVAPRO) 150 MG tablet TAKE 2 TABLETS BY MOUTH EVERY NIGHT AT BEDTIME (Patient taking differently: Take 300 mg by mouth daily.) 60 tablet 11   metFORMIN (GLUCOPHAGE) 500  MG tablet TAKE 1 TABLET(500 MG) BY MOUTH TWICE DAILY WITH A MEAL 180 tablet 0   Multiple Vitamins-Minerals (MULTIVITAMIN PO) Take by mouth daily at 12 noon.     Omega-3 Fatty Acids (FISH OIL) 1200 MG CPDR Take by mouth daily.     prochlorperazine (COMPAZINE) 10 MG tablet Take 1 tablet (10 mg total) by mouth every 6 (six) hours as needed for nausea or vomiting. 30 tablet 0   No current facility-administered medications for this visit.     Allergies: No Known Allergies   Physical Exam: Blood pressure 124/76, pulse 79, temperature 97.9 F (36.6 C), temperature source Oral, resp. rate 18, height 5\' 9"  (1.753 m), weight 238 lb 1.6 oz (108 kg), SpO2 99 %.  ECOG:  0    General appearance: Comfortable appearing without any discomfort Head: Normocephalic without any trauma Oropharynx: Mucous membranes are moist and pink without any thrush or ulcers. Eyes: Pupils are equal and round reactive to light. Lymph nodes: No cervical, supraclavicular, inguinal or axillary lymphadenopathy.   Heart:regular rate and rhythm.  S1 and S2 without leg edema. Lung: Clear without any rhonchi or wheezes.  No dullness to percussion. Abdomin: Soft, nontender, nondistended with good bowel sounds.  No hepatosplenomegaly. Musculoskeletal: No joint deformity or effusion.  Full range of motion noted. Neurological: No deficits noted on motor, sensory and deep tendon reflex exam. Skin: No petechial rash or dryness.  Appeared moist.      Lab Results: Lab Results  Component Value Date  WBC 9.3 04/25/2021   HGB 15.0 04/25/2021   HCT 42.2 04/25/2021   MCV 89.4 04/25/2021   PLT 268 04/25/2021     Chemistry      Component Value Date/Time   NA 137 04/25/2021 0730   K 4.2 04/25/2021 0730   CL 101 04/25/2021 0730   CO2 23 04/25/2021 0730   BUN 14 04/25/2021 0730   CREATININE 1.10 04/25/2021 0730   CREATININE 1.09 07/11/2019 1120      Component Value Date/Time   CALCIUM 10.0 04/25/2021 0730   ALKPHOS 94  04/25/2021 0730   AST 44 (H) 04/25/2021 0730   ALT 50 (H) 04/25/2021 0730   BILITOT 0.8 04/25/2021 0730          Impression and Plan:   55 year old with:  1 testicular cancer diagnosed in July 2022.  He was found to have T2N0 stage I nonseminomatous left tumor with 50% embryonal component with 40% yolk sac, 10% teratoma.   He is currently receiving adjuvant chemotherapy currently completing BEP cycle 1.  The natural course of his disease was reviewed and treatment choices were discussed.  The plan is to complete cycle 1 of chemotherapy and resume active surveillance after that.  He will have repeat laboratory testing and physical examination and imaging studies in 3 months and every 6 months after that for the first 2 years.  He is agreeable to proceed at this time.    2.  IV access: No issues reported with peripheral veins at this time.   3.  Pulmonary function surveillance: Baseline pulmonary function tests is adequate with normal DLCO.   4.  Follow-up: Will be in 1 week to complete cycle 1 of chemotherapy and in 3 months for repeat evaluation.   30  minutes were spent on this encounter.  The time was dedicated to reviewing his disease status, reviewing laboratory data, addressing complications related to his cancer and cancer therapy.    Zola Button, MD 9/27/20229:25 AM

## 2021-05-07 DIAGNOSIS — C801 Malignant (primary) neoplasm, unspecified: Secondary | ICD-10-CM

## 2021-05-07 HISTORY — DX: Malignant (primary) neoplasm, unspecified: C80.1

## 2021-05-09 ENCOUNTER — Other Ambulatory Visit: Payer: Self-pay | Admitting: *Deleted

## 2021-05-09 MED ORDER — MAGIC MOUTHWASH: 5.0000 mL | Freq: Four times a day (QID) | ORAL | 1 refills | Status: DC | PRN

## 2021-05-09 NOTE — Progress Notes (Signed)
Magic mouth wash called in to pt preferred pharmacy. Pt called and made aware.

## 2021-05-10 ENCOUNTER — Inpatient Hospital Stay: Payer: 59 | Attending: Oncology

## 2021-05-10 ENCOUNTER — Inpatient Hospital Stay: Payer: 59

## 2021-05-10 ENCOUNTER — Other Ambulatory Visit: Payer: Self-pay

## 2021-05-10 VITALS — BP 122/72 | HR 82 | Temp 98.0°F | Resp 20 | Wt 231.5 lb

## 2021-05-10 DIAGNOSIS — C6292 Malignant neoplasm of left testis, unspecified whether descended or undescended: Secondary | ICD-10-CM

## 2021-05-10 DIAGNOSIS — Z5111 Encounter for antineoplastic chemotherapy: Secondary | ICD-10-CM | POA: Insufficient documentation

## 2021-05-10 LAB — CMP (CANCER CENTER ONLY)
ALT: 23 U/L (ref 0–44)
AST: 22 U/L (ref 15–41)
Albumin: 2.2 g/dL — ABNORMAL LOW (ref 3.5–5.0)
Alkaline Phosphatase: 59 U/L (ref 38–126)
Anion gap: 13 (ref 5–15)
BUN: 17 mg/dL (ref 6–20)
CO2: 23 mmol/L (ref 22–32)
Calcium: 8.9 mg/dL (ref 8.9–10.3)
Chloride: 92 mmol/L — ABNORMAL LOW (ref 98–111)
Creatinine: 1.02 mg/dL (ref 0.61–1.24)
GFR, Estimated: >60 mL/min (ref >60)
Glucose, Bld: 156 mg/dL — ABNORMAL HIGH (ref 70–99)
Potassium: 3.1 mmol/L — ABNORMAL LOW (ref 3.5–5.1)
Sodium: 128 mmol/L — ABNORMAL LOW (ref 135–145)
Total Bilirubin: 0.3 mg/dL (ref 0.3–1.2)
Total Protein: 5.7 g/dL — ABNORMAL LOW (ref 6.5–8.1)

## 2021-05-10 LAB — CBC WITH DIFFERENTIAL (CANCER CENTER ONLY)
Abs Immature Granulocytes: 0.63 10*3/uL — ABNORMAL HIGH (ref 0.00–0.07)
Basophils Absolute: 0 10*3/uL (ref 0.0–0.1)
Basophils Relative: 1 %
Eosinophils Absolute: 0.1 10*3/uL (ref 0.0–0.5)
Eosinophils Relative: 2 %
HCT: 30.9 % — ABNORMAL LOW (ref 39.0–52.0)
Hemoglobin: 11.8 g/dL — ABNORMAL LOW (ref 13.0–17.0)
Immature Granulocytes: 16 %
Lymphocytes Relative: 26 %
Lymphs Abs: 1.1 10*3/uL (ref 0.7–4.0)
MCH: 32.2 pg (ref 26.0–34.0)
MCHC: 38.2 g/dL — ABNORMAL HIGH (ref 30.0–36.0)
MCV: 84.4 fL (ref 80.0–100.0)
Monocytes Absolute: 0.9 10*3/uL (ref 0.1–1.0)
Monocytes Relative: 22 %
Neutro Abs: 1.4 10*3/uL — ABNORMAL LOW (ref 1.7–7.7)
Neutrophils Relative %: 33 %
Platelet Count: 149 10*3/uL — ABNORMAL LOW (ref 150–400)
RBC: 3.66 MIL/uL — ABNORMAL LOW (ref 4.22–5.81)
RDW: 11.7 % (ref 11.5–15.5)
WBC Count: 4 10*3/uL (ref 4.0–10.5)
nRBC: 0 % (ref 0.0–0.2)

## 2021-05-10 MED ORDER — SODIUM CHLORIDE 0.9 % IV SOLN: Freq: Once | INTRAVENOUS | Status: AC

## 2021-05-10 MED ORDER — PROCHLORPERAZINE MALEATE 10 MG PO TABS
10.0000 mg | ORAL_TABLET | Freq: Once | ORAL | Status: AC
  Administered 2021-05-10: 10 mg via ORAL
  Filled 2021-05-10: qty 1

## 2021-05-10 MED ORDER — SODIUM CHLORIDE 0.9 % IV SOLN
30.0000 [IU] | Freq: Once | INTRAVENOUS | Status: AC
  Administered 2021-05-10: 30 [IU] via INTRAVENOUS
  Filled 2021-05-10: qty 10

## 2021-05-10 NOTE — Patient Instructions (Signed)
[facility]  Discharge Instructions: Thank you for choosing [facility] to provide your oncology and hematology care.   If you have a lab appointment with the [facility], please go directly to the [facility] and check in at the registration area.   Wear comfortable clothing and clothing appropriate for easy access to any Portacath or PICC line.   We strive to give you quality time with your provider. You may need to reschedule your appointment if you arrive late (15 or more minutes).  Arriving late affects you and other patients whose appointments are after yours.  Also, if you miss three or more appointments without notifying the office, you may be dismissed from the clinic at the provider's discretion.      For prescription refill requests, have your pharmacy contact our office and allow 72 hours for refills to be completed.    Today you received the following chemotherapy and/or immunotherapy agents Bleomycin      To help prevent nausea and vomiting after your treatment, we encourage you to take your nausea medication as directed.  BELOW ARE SYMPTOMS THAT SHOULD BE REPORTED IMMEDIATELY: *FEVER GREATER THAN 100.4 F (38 C) OR HIGHER *CHILLS OR SWEATING *NAUSEA AND VOMITING THAT IS NOT CONTROLLED WITH YOUR NAUSEA MEDICATION *UNUSUAL SHORTNESS OF BREATH *UNUSUAL BRUISING OR BLEEDING *URINARY PROBLEMS (pain or burning when urinating, or frequent urination) *BOWEL PROBLEMS (unusual diarrhea, constipation, pain near the anus) TENDERNESS IN MOUTH AND THROAT WITH OR WITHOUT PRESENCE OF ULCERS (sore throat, sores in mouth, or a toothache) UNUSUAL RASH, SWELLING OR PAIN  UNUSUAL VAGINAL DISCHARGE OR ITCHING   Items with * indicate a potential emergency and should be followed up as soon as possible or go to the Emergency Department if any problems should occur.  Please show the CHEMOTHERAPY ALERT CARD or IMMUNOTHERAPY ALERT CARD at check-in to  the Emergency Department and triage nurse.  Should you have questions after your visit or need to cancel or reschedule your appointment, please contact [facility]  Dept: [phone]  and follow the prompts.  Office hours are 8:00 a.m. to 4:30 p.m. Monday - Friday. Please note that voicemails left after 4:00 p.m. may not be returned until the following business day.  We are closed weekends and major holidays. You have access to a nurse at all times for urgent questions. Please call the main number to the clinic Dept: [phone] and follow the prompts.   For any non-urgent questions, you may also contact your provider using MyChart. We now offer e-Visits for anyone 42 and older to request care online for non-urgent symptoms. For details visit [email or web address].   Also download the MyChart app! Go to the app store, search "MyChart", open the app, select [facility], and log in with your MyChart username and password.  Due to Covid, a mask is required upon entering the hospital/clinic. If you do not have a mask, one will be given to you upon arrival. For doctor visits, patients may have 1 support person aged 109 or older with them. For treatment visits, patients cannot have anyone with them due to current Covid guidelines and our immunocompromised population.

## 2021-05-10 NOTE — Progress Notes (Signed)
ANC 1.4.  Per treatment plan "treat despite counts."

## 2021-05-13 ENCOUNTER — Telehealth: Payer: Self-pay | Admitting: *Deleted

## 2021-05-13 NOTE — Telephone Encounter (Signed)
Received vm call from pt from yest pm stating that he has some hardened/painful areas on forearms from IV's & is asking what to do for that.  Returned call & left message on identified vm to apply warm compresses 3-4 x's /day to help but if there is redness/swelling/pain/heat should probably have someone look at sites.  Message routed to Dr Corliss Parish RN.

## 2021-07-13 ENCOUNTER — Other Ambulatory Visit: Payer: Self-pay | Admitting: Family Medicine

## 2021-07-15 LAB — HM DIABETES EYE EXAM

## 2021-07-21 ENCOUNTER — Other Ambulatory Visit: Payer: Self-pay | Admitting: Family Medicine

## 2021-08-03 ENCOUNTER — Inpatient Hospital Stay: Payer: 59 | Attending: Oncology

## 2021-08-03 ENCOUNTER — Other Ambulatory Visit: Payer: Self-pay

## 2021-08-03 ENCOUNTER — Ambulatory Visit (HOSPITAL_COMMUNITY)
Admission: RE | Admit: 2021-08-03 | Discharge: 2021-08-03 | Disposition: A | Discharge status: Home / Self Care | Payer: 59 | Source: Ambulatory Visit | Attending: Oncology | Admitting: Oncology

## 2021-08-03 DIAGNOSIS — C6292 Malignant neoplasm of left testis, unspecified whether descended or undescended: Secondary | ICD-10-CM | POA: Insufficient documentation

## 2021-08-03 LAB — CMP (CANCER CENTER ONLY)
ALT: 37 U/L (ref 0–44)
AST: 31 U/L (ref 15–41)
Albumin: 4.2 g/dL (ref 3.5–5.0)
Alkaline Phosphatase: 84 U/L (ref 38–126)
Anion gap: 10 (ref 5–15)
BUN: 10 mg/dL (ref 6–20)
CO2: 28 mmol/L (ref 22–32)
Calcium: 10 mg/dL (ref 8.9–10.3)
Chloride: 101 mmol/L (ref 98–111)
Creatinine: 1.06 mg/dL (ref 0.61–1.24)
GFR, Estimated: >60 mL/min (ref >60)
Glucose, Bld: 126 mg/dL — ABNORMAL HIGH (ref 70–99)
Potassium: 4 mmol/L (ref 3.5–5.1)
Sodium: 139 mmol/L (ref 135–145)
Total Bilirubin: 0.6 mg/dL (ref 0.3–1.2)
Total Protein: 7.5 g/dL (ref 6.5–8.1)

## 2021-08-03 LAB — CBC WITH DIFFERENTIAL (CANCER CENTER ONLY)
Abs Immature Granulocytes: 0.03 10*3/uL (ref 0.00–0.07)
Basophils Absolute: 0 10*3/uL (ref 0.0–0.1)
Basophils Relative: 1 %
Eosinophils Absolute: 0.1 10*3/uL (ref 0.0–0.5)
Eosinophils Relative: 2 %
HCT: 40.1 % (ref 39.0–52.0)
Hemoglobin: 14.5 g/dL (ref 13.0–17.0)
Immature Granulocytes: 0 %
Lymphocytes Relative: 22 %
Lymphs Abs: 1.9 10*3/uL (ref 0.7–4.0)
MCH: 33.7 pg (ref 26.0–34.0)
MCHC: 36.2 g/dL — ABNORMAL HIGH (ref 30.0–36.0)
MCV: 93.3 fL (ref 80.0–100.0)
Monocytes Absolute: 0.9 10*3/uL (ref 0.1–1.0)
Monocytes Relative: 10 %
Neutro Abs: 5.6 10*3/uL (ref 1.7–7.7)
Neutrophils Relative %: 65 %
Platelet Count: 217 10*3/uL (ref 150–400)
RBC: 4.3 MIL/uL (ref 4.22–5.81)
RDW: 12.2 % (ref 11.5–15.5)
WBC Count: 8.6 10*3/uL (ref 4.0–10.5)
nRBC: 0 % (ref 0.0–0.2)

## 2021-08-03 MED ORDER — SODIUM CHLORIDE (PF) 0.9 % IJ SOLN
INTRAMUSCULAR | Status: AC
  Filled 2021-08-03: qty 50

## 2021-08-03 MED ORDER — IOHEXOL 350 MG/ML SOLN
75.0000 mL | Freq: Once | INTRAVENOUS | Status: AC | PRN
  Administered 2021-08-03: 11:00:00 75 mL via INTRAVENOUS

## 2021-08-10 ENCOUNTER — Other Ambulatory Visit: Payer: Self-pay

## 2021-08-10 ENCOUNTER — Inpatient Hospital Stay: Payer: BC Managed Care – PPO | Attending: Oncology | Admitting: Oncology

## 2021-08-10 ENCOUNTER — Encounter: Payer: Self-pay | Admitting: Oncology

## 2021-08-10 VITALS — BP 146/84 | HR 75 | Temp 97.9°F | Resp 18 | Ht 69.0 in | Wt 228.8 lb

## 2021-08-10 DIAGNOSIS — I700 Atherosclerosis of aorta: Secondary | ICD-10-CM | POA: Diagnosis not present

## 2021-08-10 DIAGNOSIS — Z79899 Other long term (current) drug therapy: Secondary | ICD-10-CM | POA: Diagnosis not present

## 2021-08-10 DIAGNOSIS — C6292 Malignant neoplasm of left testis, unspecified whether descended or undescended: Secondary | ICD-10-CM | POA: Diagnosis not present

## 2021-08-10 DIAGNOSIS — R918 Other nonspecific abnormal finding of lung field: Secondary | ICD-10-CM | POA: Insufficient documentation

## 2021-08-10 DIAGNOSIS — K760 Fatty (change of) liver, not elsewhere classified: Secondary | ICD-10-CM | POA: Insufficient documentation

## 2021-08-10 NOTE — Progress Notes (Signed)
Hematology and Oncology Follow Up Visit  Leonard Garrett [id number] 1966/04/08 56 y.o. 08/10/2021 10:24 AM Leonard Garrett [name], MDPickard, [name], MD   Principle Diagnosis: 56 year old with testicular cancer diagnosed in July 2022.  He was found to have stage I left testicular tumor  with 50% embryonal component.   Prior Therapy: He underwent left orchiectomy on February 18, 2021.  The final pathology showed a 6.2 cm tumor that is 50% embryonal, 40% yolk sac and 10% teratoma.  Final pathological staging was T2 disease.   His a post orchiectomy alpha-fetoprotein was 4.4 with beta-hCG less than 1.  CT scan on February 25, 2020 showed no evidence of metastatic disease although there is a mildly enlarged right external iliac lymph node measuring 1.1 cm.  BEP chemotherapy started on 04/25/2021.  He completed this cycle in September 2022.  Current therapy: Active surveillance  Interim History: [name] presents today for repeat evaluation.  Since last visit, he reports no major complaints.  He denies any residual complications at this time.  He denies any nausea, vomiting or respiratory complaints.  He denies any cough wheezing or hemoptysis.  He denies any excessive fatigue.     Medications: Updated on review Current Outpatient Medications  Medication Sig Dispense Refill   amLODipine (NORVASC) 5 MG tablet TAKE 1 TABLET(5 MG) BY MOUTH DAILY 90 tablet 3   aspirin 81 MG tablet Take 81 mg by mouth daily.     HYDROcodone-acetaminophen (NORCO/VICODIN) 5-325 MG tablet Take 1 tablet by mouth every 4 (four) hours as needed for moderate pain. 20 tablet 0   irbesartan (AVAPRO) 150 MG tablet TAKE 2 TABLETS BY MOUTH EVERY NIGHT AT BEDTIME (Patient taking differently: Take 300 mg by mouth daily.) 60 tablet 11   metFORMIN (GLUCOPHAGE) 500 MG tablet TAKE 1 TABLET(500 MG) BY MOUTH TWICE DAILY WITH A MEAL 180 tablet 0   Multiple Vitamins-Minerals (MULTIVITAMIN PO) Take by mouth daily at 12 noon.     Omega-3 Fatty Acids  (FISH OIL) 1200 MG CPDR Take by mouth daily.     prochlorperazine (COMPAZINE) 10 MG tablet Take 1 tablet (10 mg total) by mouth every 6 (six) hours as needed for nausea or vomiting. 30 tablet 0   No current facility-administered medications for this visit.     Allergies: No Known Allergies   Physical Exam:  Blood pressure (!) 146/84, pulse 75, temperature 97.9 F (36.6 C), temperature source Temporal, resp. rate 18, height 5\' 9"  (1.753 m), weight 228 lb 12.8 oz (103.8 kg), SpO2 100 %.  ECOG:  0   General appearance: Alert, awake without any distress. Head: Atraumatic without abnormalities Oropharynx: Without any thrush or ulcers. Eyes: No scleral icterus. Lymph nodes: No lymphadenopathy noted in the cervical, supraclavicular, or axillary nodes Heart:regular rate and rhythm, without any murmurs or gallops.   Lung: Clear to auscultation without any rhonchi, wheezes or dullness to percussion. Abdomin: Soft, nontender without any shifting dullness or ascites. Musculoskeletal: No clubbing or cyanosis. Neurological: No motor or sensory deficits. Skin: No rashes or lesions.      Lab Results: Lab Results  Component Value Date   WBC 8.6 08/03/2021   HGB 14.5 08/03/2021   HCT 40.1 08/03/2021   MCV 93.3 08/03/2021   PLT 217 08/03/2021     Chemistry      Component Value Date/Time   NA 139 08/03/2021 1004   K 4.0 08/03/2021 1004   CL 101 08/03/2021 1004   CO2 28 08/03/2021 1004   BUN 10  08/03/2021 1004   CREATININE 1.06 08/03/2021 1004   CREATININE 1.09 07/11/2019 1120      Component Value Date/Time   CALCIUM 10.0 08/03/2021 1004   ALKPHOS 84 08/03/2021 1004   AST 31 08/03/2021 1004   ALT 37 08/03/2021 1004   BILITOT 0.6 08/03/2021 1004      IMPRESSION: 1. Interval decrease in size of the right external iliac lymph node. No evidence of new or progressive disease in the abdomen or pelvis. 2. New tiny adjacent pulmonary nodules in the dependent right lower lobe  measuring up to 3 mm, which are nonspecific and favored infectious or inflammatory. However, metastatic disease is not entirely excluded. Recommend attention on close interval three-month follow-up dedicated chest CT. 3. Short segment wall thickening of the sigmoid colon, at least in part related to under distension however etiologies such as Segmental colitis associated with diverticulosis or underlying mass lesion are not excluded. Consider correlation with colonoscopy. 4. Diffuse hepatic steatosis. 5.  Aortic Atherosclerosis (ICD10-I70.0).    Impression and Plan:   56 year old with:  1.  T2N0 testicular cancer diagnosed in July 2022.  He was found to have  nonseminomatous left tumor with 50% embryonal component with 40% yolk sac, 10% teratoma.   He is currently on active surveillance without any evidence of relapsed disease.  CT scan obtained on 08/03/2021 showed no evidence of disease progression.  I recommended continued active surveillance at this time.  We we will arrange for a CT scan in 6 months and repeat laboratory testing.  Salvage therapy including chemotherapy utilizing platinum based agents will be used if he has relapsed disease.     2.  Pulmonary function surveillance: No complications related to bleomycin noted at this time.   3.  Follow-up: In 6 months for repeat follow-up.   30  minutes were dedicated to this visit.  The time spent on reviewing his disease status, treatment choices and outlining future plan of care discussion.    Zola Button, MD 1/4/202310:24 AM [facility]

## 2021-08-24 ENCOUNTER — Telehealth: Payer: Self-pay | Admitting: Oncology

## 2021-08-24 NOTE — Telephone Encounter (Signed)
Sch per 1/6 los

## 2021-08-30 ENCOUNTER — Encounter: Payer: Self-pay | Admitting: Family Medicine

## 2021-08-31 ENCOUNTER — Other Ambulatory Visit: Payer: Self-pay

## 2021-08-31 DIAGNOSIS — I10 Essential (primary) hypertension: Secondary | ICD-10-CM

## 2021-08-31 DIAGNOSIS — E785 Hyperlipidemia, unspecified: Secondary | ICD-10-CM

## 2021-08-31 DIAGNOSIS — R7303 Prediabetes: Secondary | ICD-10-CM

## 2021-09-02 ENCOUNTER — Other Ambulatory Visit: Payer: BC Managed Care – PPO

## 2021-09-02 ENCOUNTER — Encounter: Payer: Self-pay | Admitting: Oncology

## 2021-09-02 ENCOUNTER — Other Ambulatory Visit: Payer: Self-pay

## 2021-09-02 DIAGNOSIS — I10 Essential (primary) hypertension: Secondary | ICD-10-CM | POA: Diagnosis not present

## 2021-09-02 DIAGNOSIS — R7303 Prediabetes: Secondary | ICD-10-CM

## 2021-09-02 DIAGNOSIS — Z135 Encounter for screening for eye and ear disorders: Secondary | ICD-10-CM | POA: Diagnosis not present

## 2021-09-02 DIAGNOSIS — E785 Hyperlipidemia, unspecified: Secondary | ICD-10-CM | POA: Diagnosis not present

## 2021-09-03 ENCOUNTER — Encounter: Payer: Self-pay | Admitting: Oncology

## 2021-09-05 ENCOUNTER — Encounter: Payer: Self-pay | Admitting: Oncology

## 2021-09-05 ENCOUNTER — Ambulatory Visit (INDEPENDENT_AMBULATORY_CARE_PROVIDER_SITE_OTHER): Payer: BC Managed Care – PPO | Admitting: Family Medicine

## 2021-09-05 ENCOUNTER — Other Ambulatory Visit: Payer: Self-pay

## 2021-09-05 ENCOUNTER — Encounter: Payer: Self-pay | Admitting: Family Medicine

## 2021-09-05 VITALS — BP 138/72 | HR 67 | Temp 96.9°F | Resp 18 | Ht 69.0 in | Wt 228.0 lb

## 2021-09-05 DIAGNOSIS — Z23 Encounter for immunization: Secondary | ICD-10-CM

## 2021-09-05 DIAGNOSIS — D126 Benign neoplasm of colon, unspecified: Secondary | ICD-10-CM | POA: Diagnosis not present

## 2021-09-05 DIAGNOSIS — Z0000 Encounter for general adult medical examination without abnormal findings: Secondary | ICD-10-CM

## 2021-09-05 DIAGNOSIS — R7303 Prediabetes: Secondary | ICD-10-CM | POA: Diagnosis not present

## 2021-09-05 DIAGNOSIS — C6292 Malignant neoplasm of left testis, unspecified whether descended or undescended: Secondary | ICD-10-CM

## 2021-09-05 DIAGNOSIS — Z0001 Encounter for general adult medical examination with abnormal findings: Secondary | ICD-10-CM | POA: Diagnosis not present

## 2021-09-05 DIAGNOSIS — I10 Essential (primary) hypertension: Secondary | ICD-10-CM

## 2021-09-05 DIAGNOSIS — E785 Hyperlipidemia, unspecified: Secondary | ICD-10-CM

## 2021-09-05 NOTE — Progress Notes (Signed)
Subjective:    Patient ID: Leonard Garrett, male    DOB: Oct 31, 1965, 56 y.o.   MRN: [id number]  Patient was last seen by me in 2020.  Since that time, was diagnosed with left testicular cancer and underwent orchiectomy.  He is here today for complete physical exam.  His last colonoscopy was more than 5 years ago.  They found a tubular adenoma.  They recommended a repeat colonoscopy in 5 years.  Therefore he is due for this.  He is also due to check a PSA to screen for prostate cancer.  His immunizations are up-to-date except for Pneumovax 23.  He would like to receive that today.  Recently had a CT scan that showed no recurrence of cancer.  Showed progression of right iliac lymph node.  Did show nonspecific colitis prompting the patient to proceed with a GI consultation.  Most recent lab work is listed below: Immunization History  Administered Date(s) Administered   Influenza Inj Mdck Quad Pf 05/23/2019   Pneumococcal Polysaccharide-23 08/17/2015, 09/05/2021   Tdap 12/22/2014   Zoster Recombinat (Shingrix) 11/20/2016   Appointment on 09/02/2021  Component Date Value Ref Range Status   Glucose, Bld 09/02/2021 111 (H)  65 - 99 mg/dL Final   Comment: .            Fasting reference interval . For someone without known diabetes, a glucose value between 100 and 125 mg/dL is consistent with prediabetes and should be confirmed with a follow-up test. .    BUN 09/02/2021 14  7 - 25 mg/dL Final   Creat 09/02/2021 1.07  0.70 - 1.30 mg/dL Final   BUN/Creatinine Ratio 37/85/8850 NOT APPLICABLE  6 - 22 (calc) Final   Sodium 09/02/2021 139  135 - 146 mmol/L Final   Potassium 09/02/2021 4.5  3.5 - 5.3 mmol/L Final   Chloride 09/02/2021 103  98 - 110 mmol/L Final   CO2 09/02/2021 29  20 - 32 mmol/L Final   Calcium 09/02/2021 10.5 (H)  8.6 - 10.3 mg/dL Final   Total Protein 09/02/2021 7.0  6.1 - 8.1 g/dL Final   Albumin 09/02/2021 4.2  3.6 - 5.1 g/dL Final   Globulin 09/02/2021 2.8  1.9 - 3.7 g/dL  (calc) Final   AG Ratio 09/02/2021 1.5  1.0 - 2.5 (calc) Final   Total Bilirubin 09/02/2021 0.3  0.2 - 1.2 mg/dL Final   Alkaline phosphatase (APISO) 09/02/2021 91  35 - 144 U/L Final   AST 09/02/2021 20  10 - 35 U/L Final   ALT 09/02/2021 21  9 - 46 U/L Final   WBC 09/02/2021 9.4  3.8 - 10.8 Thousand/uL Final   RBC 09/02/2021 4.50  4.20 - 5.80 Million/uL Final   Hemoglobin 09/02/2021 14.8  13.2 - 17.1 g/dL Final   HCT 09/02/2021 42.7  38.5 - 50.0 % Final   MCV 09/02/2021 94.9  80.0 - 100.0 fL Final   MCH 09/02/2021 32.9  27.0 - 33.0 pg Final   MCHC 09/02/2021 34.7  32.0 - 36.0 g/dL Final   RDW 09/02/2021 11.6  11.0 - 15.0 % Final   Platelets 09/02/2021 255  140 - 400 Thousand/uL Final   MPV 09/02/2021 9.9  7.5 - 12.5 fL Final   Neutro Abs 09/02/2021 6,411  1,500 - 7,800 cells/uL Final   Lymphs Abs 09/02/2021 1,786  850 - 3,900 cells/uL Final   Absolute Monocytes 09/02/2021 902  200 - 950 cells/uL Final   Eosinophils Absolute 09/02/2021 263  15 - 500  cells/uL Final   Basophils Absolute 09/02/2021 38  0 - 200 cells/uL Final   Neutrophils Relative % 09/02/2021 68.2  % Final   Total Lymphocyte 09/02/2021 19.0  % Final   Monocytes Relative 09/02/2021 9.6  % Final   Eosinophils Relative 09/02/2021 2.8  % Final   Basophils Relative 09/02/2021 0.4  % Final   Cholesterol 09/02/2021 171  <200 mg/dL Final   HDL 09/02/2021 39 (L)  > OR = 40 mg/dL Final   Triglycerides 09/02/2021 289 (H)  <150 mg/dL Final   Comment: . If a non-fasting specimen was collected, consider repeat triglyceride testing on a fasting specimen if clinically indicated.  Yates Decamp et al. J. of Clin. Lipidol. 3419;3:790-240. Marland Kitchen    LDL Cholesterol (Calc) 09/02/2021 91  mg/dL (calc) Final   Comment: Reference range: <100 . Desirable range <100 mg/dL for primary prevention;   <70 mg/dL for patients with CHD or diabetic patients  with > or = 2 CHD risk factors. Marland Kitchen LDL-C is now calculated using the Martin-Hopkins   calculation, which is a validated novel method providing  better accuracy than the Friedewald equation in the  estimation of LDL-C.  Cresenciano Genre et al. Annamaria Helling. 9735;329(92): 2061-2068  (http://education.[facility].com/faq/FAQ164)    Total CHOL/HDL Ratio 09/02/2021 4.4  <5.0 (calc) Final   Non-HDL Cholesterol (Calc) 09/02/2021 132 (H)  <130 mg/dL (calc) Final   Comment: For patients with diabetes plus 1 major ASCVD risk  factor, treating to a non-HDL-C goal of <100 mg/dL  (LDL-C of <70 mg/dL) is considered a therapeutic  option.    Hgb A1c MFr Bld 09/02/2021 6.0 (H)  <5.7 % of total Hgb Final   Comment: For someone without known diabetes, a hemoglobin  A1c value between 5.7% and 6.4% is consistent with prediabetes and should be confirmed with a  follow-up test. . For someone with known diabetes, a value <7% indicates that their diabetes is well controlled. A1c targets should be individualized based on duration of diabetes, age, comorbid conditions, and other considerations. . This assay result is consistent with an increased risk of diabetes. . Currently, no consensus exists regarding use of hemoglobin A1c for diagnosis of diabetes for children. .    Mean Plasma Glucose 09/02/2021 126  mg/dL Final   eAG (mmol/L) 09/02/2021 7.0  mmol/L Final    Past Medical History:  Diagnosis Date   History of fatty infiltration of liver 2017   Hyperlipidemia    Hypertension    followed by pcp   Mass of left testis    Seasonal allergies    Type 2 diabetes mellitus ([place])    followed by pcp----  (02-15-2021  pt stated does not check blood surgar at home)   Wears glasses    Past Surgical History:  Procedure Laterality Date   CLOSED REDUCTION FOREARM FRACTURE Left 1978   KNEE ARTHROSCOPY Right 2011   ORCHIECTOMY Left 02/18/2021   Procedure: INGUINAL RADICAL ORCHIECTOMY;  Surgeon: Robley Fries, MD;  Location: [facility];  Service: Urology;  Laterality: Left;    RETINAL DETACHMENT SURGERY Right 2011   TONSILLECTOMY  1970   Current Outpatient Medications on File Prior to Visit  Medication Sig Dispense Refill   amLODipine (NORVASC) 5 MG tablet TAKE 1 TABLET(5 MG) BY MOUTH DAILY 90 tablet 3   aspirin 81 MG tablet Take 81 mg by mouth daily.     irbesartan (AVAPRO) 150 MG tablet TAKE 2 TABLETS BY MOUTH EVERY NIGHT AT BEDTIME (Patient taking differently: Take  300 mg by mouth daily.) 60 tablet 11   metFORMIN (GLUCOPHAGE) 500 MG tablet TAKE 1 TABLET(500 MG) BY MOUTH TWICE DAILY WITH A MEAL 180 tablet 0   Multiple Vitamins-Minerals (MULTIVITAMIN PO) Take by mouth daily at 12 noon.     Omega-3 Fatty Acids (FISH OIL) 1200 MG CPDR Take by mouth daily.     prochlorperazine (COMPAZINE) 10 MG tablet Take 1 tablet (10 mg total) by mouth every 6 (six) hours as needed for nausea or vomiting. (Patient not taking: Reported on 09/05/2021) 30 tablet 0   No current facility-administered medications on file prior to visit.   No Known Allergies Social History   Socioeconomic History   Marital status: Widowed    Spouse name: Not on file   Number of children: Not on file   Years of education: Not on file   Highest education level: Not on file  Occupational History   Not on file  Tobacco Use   Smoking status: Never   Smokeless tobacco: Never  Vaping Use   Vaping Use: Never used  Substance and Sexual Activity   Alcohol use: Not Currently    Comment: occasional   Drug use: Never   Sexual activity: Not on file    Comment: VASECTOMY  199  Other Topics Concern   Not on file  Social History Narrative   Not on file   Social Determinants of Health   Financial Resource Strain: Not on file  Food Insecurity: Not on file  Transportation Needs: Not on file  Physical Activity: Not on file  Stress: Not on file  Social Connections: Not on file  Intimate Partner Violence: Not on file     Review of Systems  All other systems reviewed and are negative.      Objective:   Physical Exam Vitals reviewed.  Constitutional:      General: He is not in acute distress.    Appearance: Normal appearance. He is well-developed and normal weight. He is not ill-appearing, toxic-appearing or diaphoretic.  HENT:     Head: Normocephalic and atraumatic.     Right Ear: Tympanic membrane, ear canal and external ear normal.     Left Ear: Tympanic membrane, ear canal and external ear normal.     Mouth/Throat:     Mouth: Mucous membranes are moist.     Pharynx: Oropharynx is clear. No oropharyngeal exudate or posterior oropharyngeal erythema.  Eyes:     General: No scleral icterus.       Right eye: No discharge.        Left eye: No discharge.     Extraocular Movements: Extraocular movements intact.     Conjunctiva/sclera: Conjunctivae normal.     Pupils: Pupils are equal, round, and reactive to light.  Neck:     Vascular: No carotid bruit.  Cardiovascular:     Rate and Rhythm: Normal rate and regular rhythm.     Pulses: Normal pulses.     Heart sounds: Normal heart sounds. No murmur heard.   No friction rub. No gallop.  Pulmonary:     Effort: Pulmonary effort is normal. No respiratory distress.     Breath sounds: Normal breath sounds. No stridor. No wheezing, rhonchi or rales.  Abdominal:     General: Bowel sounds are normal. There is no distension.     Palpations: Abdomen is soft.     Tenderness: There is no abdominal tenderness. There is no guarding or rebound.     Hernia: A hernia is  present.  Musculoskeletal:     Cervical back: Neck supple. No rigidity or tenderness.     Right lower leg: No edema.     Left lower leg: No edema.  Lymphadenopathy:     Cervical: No cervical adenopathy.  Skin:    Coloration: Skin is not jaundiced.     Findings: No bruising, erythema, lesion or rash.  Neurological:     General: No focal deficit present.     Mental Status: He is alert and oriented to person, place, and time. Mental status is at baseline.      Cranial Nerves: No cranial nerve deficit.     Sensory: No sensory deficit.     Motor: No weakness.     Coordination: Coordination normal.     Gait: Gait normal.     Deep Tendon Reflexes: Reflexes normal.  Psychiatric:        Mood and Affect: Mood normal.        Behavior: Behavior normal.        Thought Content: Thought content normal.        Judgment: Judgment normal.          Assessment & Plan:  Tubular adenoma of colon - Plan: Ambulatory referral to Gastroenterology  Need for pneumococcal vaccination - Plan: Pneumococcal polysaccharide vaccine 23-valent greater than or equal to 2yo subcutaneous/IM  Prediabetes  Primary hypertension  Hyperlipidemia, unspecified hyperlipidemia type  Malignant neoplasm of left testis, unspecified whether descended or undescended [facility])  General medical exam Physical exam today is excellent.  I am happy with his A1c which is 6.0.  His blood pressure is outstanding and he has several other values that look even better at home.  He received a booster on Pneumovax 23 today.  The remainder of his immunizations are up-to-date.  I will consult GI for colonoscopy.  I will add a PSA to his lab work to screen for prostate cancer.  I am concerned that his triglycerides are elevated so I would like him to increase his omega-3 fatty acids to 2000 mg a day.  Regular anticipatory guidance is provided.  Recheck in 6 months.

## 2021-09-06 ENCOUNTER — Encounter: Payer: Self-pay | Admitting: Oncology

## 2021-09-06 LAB — CBC WITH DIFFERENTIAL/PLATELET
Absolute Monocytes: 902 cells/uL (ref 200–950)
Basophils Absolute: 38 cells/uL (ref 0–200)
Basophils Relative: 0.4 %
Eosinophils Absolute: 263 cells/uL (ref 15–500)
Eosinophils Relative: 2.8 %
HCT: 42.7 % (ref 38.5–50.0)
Hemoglobin: 14.8 g/dL (ref 13.2–17.1)
Lymphs Abs: 1786 cells/uL (ref 850–3900)
MCH: 32.9 pg (ref 27.0–33.0)
MCHC: 34.7 g/dL (ref 32.0–36.0)
MCV: 94.9 fL (ref 80.0–100.0)
MPV: 9.9 fL (ref 7.5–12.5)
Monocytes Relative: 9.6 %
Neutro Abs: 6411 cells/uL (ref 1500–7800)
Neutrophils Relative %: 68.2 %
Platelets: 255 10*3/uL (ref 140–400)
RBC: 4.5 10*6/uL (ref 4.20–5.80)
RDW: 11.6 % (ref 11.0–15.0)
Total Lymphocyte: 19 %
WBC: 9.4 10*3/uL (ref 3.8–10.8)

## 2021-09-06 LAB — COMPREHENSIVE METABOLIC PANEL
AG Ratio: 1.5 (calc) (ref 1.0–2.5)
ALT: 21 U/L (ref 9–46)
AST: 20 U/L (ref 10–35)
Albumin: 4.2 g/dL (ref 3.6–5.1)
Alkaline phosphatase (APISO): 91 U/L (ref 35–144)
BUN: 14 mg/dL (ref 7–25)
CO2: 29 mmol/L (ref 20–32)
Calcium: 10.5 mg/dL — ABNORMAL HIGH (ref 8.6–10.3)
Chloride: 103 mmol/L (ref 98–110)
Creat: 1.07 mg/dL (ref 0.70–1.30)
Globulin: 2.8 g/dL (calc) (ref 1.9–3.7)
Glucose, Bld: 111 mg/dL — ABNORMAL HIGH (ref 65–99)
Potassium: 4.5 mmol/L (ref 3.5–5.3)
Sodium: 139 mmol/L (ref 135–146)
Total Bilirubin: 0.3 mg/dL (ref 0.2–1.2)
Total Protein: 7 g/dL (ref 6.1–8.1)

## 2021-09-06 LAB — TEST AUTHORIZATION

## 2021-09-06 LAB — LIPID PANEL
Cholesterol: 171 mg/dL (ref <200)
HDL: 39 mg/dL — ABNORMAL LOW (ref >=40)
LDL Cholesterol (Calc): 91 mg/dL (calc)
Non-HDL Cholesterol (Calc): 132 mg/dL (calc) — ABNORMAL HIGH (ref <130)
Total CHOL/HDL Ratio: 4.4 (calc) (ref <5.0)
Triglycerides: 289 mg/dL — ABNORMAL HIGH (ref <150)

## 2021-09-06 LAB — HEMOGLOBIN A1C
Hgb A1c MFr Bld: 6 % of total Hgb — ABNORMAL HIGH (ref <5.7)
Mean Plasma Glucose: 126 mg/dL
eAG (mmol/L): 7 mmol/L

## 2021-09-06 LAB — PSA: PSA: 0.7 ng/mL (ref <=4.00)

## 2021-09-09 ENCOUNTER — Encounter: Payer: Self-pay | Admitting: Gastroenterology

## 2021-09-23 ENCOUNTER — Encounter: Payer: Self-pay | Admitting: Oncology

## 2021-10-03 ENCOUNTER — Other Ambulatory Visit: Payer: Self-pay

## 2021-10-03 ENCOUNTER — Ambulatory Visit (AMBULATORY_SURGERY_CENTER): Payer: BC Managed Care – PPO | Admitting: *Deleted

## 2021-10-03 VITALS — Ht 69.0 in | Wt 228.0 lb

## 2021-10-03 DIAGNOSIS — Z86010 Personal history of colonic polyps: Secondary | ICD-10-CM

## 2021-10-03 MED ORDER — NA SULFATE-K SULFATE-MG SULF 17.5-3.13-1.6 GM/177ML PO SOLN: 1.0000 | ORAL | 0 refills | Status: DC

## 2021-10-03 NOTE — Progress Notes (Signed)

## 2021-10-10 ENCOUNTER — Encounter: Payer: Self-pay | Admitting: Gastroenterology

## 2021-10-11 ENCOUNTER — Other Ambulatory Visit: Payer: Self-pay | Admitting: Family Medicine

## 2021-10-12 ENCOUNTER — Other Ambulatory Visit: Payer: Self-pay | Admitting: Family Medicine

## 2021-10-17 ENCOUNTER — Encounter: Payer: Self-pay | Admitting: Gastroenterology

## 2021-10-17 ENCOUNTER — Ambulatory Visit (AMBULATORY_SURGERY_CENTER): Payer: BC Managed Care – PPO | Admitting: Gastroenterology

## 2021-10-17 ENCOUNTER — Other Ambulatory Visit: Payer: Self-pay

## 2021-10-17 VITALS — BP 118/68 | HR 61 | Temp 96.6°F | Resp 16 | Ht 69.0 in | Wt 228.0 lb

## 2021-10-17 DIAGNOSIS — R933 Abnormal findings on diagnostic imaging of other parts of digestive tract: Secondary | ICD-10-CM

## 2021-10-17 DIAGNOSIS — K640 First degree hemorrhoids: Secondary | ICD-10-CM | POA: Diagnosis not present

## 2021-10-17 DIAGNOSIS — K5730 Diverticulosis of large intestine without perforation or abscess without bleeding: Secondary | ICD-10-CM

## 2021-10-17 DIAGNOSIS — Z86010 Personal history of colonic polyps: Secondary | ICD-10-CM

## 2021-10-17 MED ORDER — SODIUM CHLORIDE 0.9 % IV SOLN: 500.0000 mL | INTRAVENOUS | Status: DC

## 2021-10-17 NOTE — Progress Notes (Signed)
? ?History & Physical ? ?Primary Care Physician:  Susy Frizzle, MD ?Primary Gastroenterologist: Lucio Edward, MD ? ?CHIEF COMPLAINT:  Personal history of colon polyps  ? ?HPI: Leonard Garrett is a 56 y.o. male with a personal history of sessile serrated and adenomatous colon polyps for surveillance colonoscopy. ? ? ?Past Medical History:  ?Diagnosis Date  ? Cancer ([place]) 05/2021  ? left testicular cancer  ? History of fatty infiltration of liver 2017  ? Hyperlipidemia   ? Hypertension   ? followed by pcp  ? Mass of left testis   ? Seasonal allergies   ? Type 2 diabetes mellitus ([place])   ? followed by pcp----  (02-15-2021  pt stated does not check blood surgar at home)  ? Wears glasses   ? ? ?Past Surgical History:  ?Procedure Laterality Date  ? CLOSED REDUCTION FOREARM FRACTURE Left 1978  ? COLONOSCOPY  10/29/2014  ? Dr.Abilene Mcphee  ? KNEE ARTHROSCOPY Right 2011  ? ORCHIECTOMY Left 02/18/2021  ? Procedure: INGUINAL RADICAL ORCHIECTOMY;  Surgeon: Robley Fries, MD;  Location: [facility];  Service: Urology;  Laterality: Left;  ? RETINAL DETACHMENT SURGERY Right 2011  ? TONSILLECTOMY  1970  ? ? ?Prior to Admission medications   ?Medication Sig Start Date End Date Taking? Authorizing Provider  ?amLODipine (NORVASC) 5 MG tablet TAKE 1 TABLET(5 MG) BY MOUTH DAILY 07/13/21  Yes Susy Frizzle, MD  ?aspirin 81 MG tablet Take 81 mg by mouth daily.   Yes [provider]  ?irbesartan (AVAPRO) 150 MG tablet TAKE 2 TABLETS BY MOUTH EVERY NIGHT AT BEDTIME 10/13/21  Yes Susy Frizzle, MD  ?metFORMIN (GLUCOPHAGE) 500 MG tablet TAKE 1 TABLET(500 MG) BY MOUTH TWICE DAILY WITH A MEAL 10/11/21  Yes Susy Frizzle, MD  ?Multiple Vitamins-Minerals (MULTIVITAMIN PO) Take by mouth daily at 12 noon.   Yes [provider]  ?Omega-3 Fatty Acids (FISH OIL) 1200 MG CPDR Take by mouth daily.   Yes [provider]  ? ? ?Current Outpatient Medications  ?Medication Sig Dispense Refill  ? amLODipine  (NORVASC) 5 MG tablet TAKE 1 TABLET(5 MG) BY MOUTH DAILY 90 tablet 3  ? aspirin 81 MG tablet Take 81 mg by mouth daily.    ? irbesartan (AVAPRO) 150 MG tablet TAKE 2 TABLETS BY MOUTH EVERY NIGHT AT BEDTIME 60 tablet 11  ? metFORMIN (GLUCOPHAGE) 500 MG tablet TAKE 1 TABLET(500 MG) BY MOUTH TWICE DAILY WITH A MEAL 180 tablet 0  ? Multiple Vitamins-Minerals (MULTIVITAMIN PO) Take by mouth daily at 12 noon.    ? Omega-3 Fatty Acids (FISH OIL) 1200 MG CPDR Take by mouth daily.    ? ?Current Facility-Administered Medications  ?Medication Dose Route Frequency Provider Last Rate Last Admin  ? 0.9 %  sodium chloride infusion  500 mL Intravenous Continuous Ladene Artist, MD      ? ? ?Allergies as of 10/17/2021  ? (No Known Allergies)  ? ? ?Family History  ?Problem Relation Age of Onset  ? Colon cancer Neg Hx   ? Esophageal cancer Neg Hx   ? Rectal cancer Neg Hx   ? Stomach cancer Neg Hx   ? ALS Father   ? ? ?Social History  ? ?Socioeconomic History  ? Marital status: Widowed  ?  Spouse name: Not on file  ? Number of children: Not on file  ? Years of education: Not on file  ? Highest education level: Not on file  ?Occupational History  ?  Not on file  ?Tobacco Use  ? Smoking status: Never  ? Smokeless tobacco: Never  ?Vaping Use  ? Vaping Use: Never used  ?Substance and Sexual Activity  ? Alcohol use: Not Currently  ? Drug use: Never  ? Sexual activity: Not on file  ?  Comment: VASECTOMY  199  ?Other Topics Concern  ? Not on file  ?Social History Narrative  ? Not on file  ? ?Social Determinants of Health  ? ?Financial Resource Strain: Not on file  ?Food Insecurity: Not on file  ?Transportation Needs: Not on file  ?Physical Activity: Not on file  ?Stress: Not on file  ?Social Connections: Not on file  ?Intimate Partner Violence: Not on file  ? ? ?Review of Systems: ? ?All systems reviewed an negative except where noted in HPI. ? ?Gen: Denies any fever, chills, sweats, anorexia, fatigue, weakness, malaise, weight loss, and  sleep disorder ?CV: Denies chest pain, angina, palpitations, syncope, orthopnea, PND, peripheral edema, and claudication. ?Resp: Denies dyspnea at rest, dyspnea with exercise, cough, sputum, wheezing, coughing up blood, and pleurisy. ?GI: Denies vomiting blood, jaundice, and fecal incontinence.   Denies dysphagia or odynophagia. ?GU : Denies urinary burning, blood in urine, urinary frequency, urinary hesitancy, nocturnal urination, and urinary incontinence. ?MS: Denies joint pain, limitation of movement, and swelling, stiffness, low back pain, extremity pain. Denies muscle weakness, cramps, atrophy.  ?Derm: Denies rash, itching, dry skin, hives, moles, warts, or unhealing ulcers.  ?Psych: Denies depression, anxiety, memory loss, suicidal ideation, hallucinations, paranoia, and confusion. ?Heme: Denies bruising, bleeding, and enlarged lymph nodes. ?Neuro:  Denies any headaches, dizziness, paresthesias. ?Endo:  Denies any problems with DM, thyroid, adrenal function. ? ? ?Physical Exam: ?General:  Alert, well-developed, in NAD ?Head:  Normocephalic and atraumatic. ?Eyes:  Sclera clear, no icterus.   Conjunctiva pink. ?Ears:  Normal auditory acuity. ?Mouth:  No deformity or lesions.  ?Neck:  Supple; no masses . ?Lungs:  Clear throughout to auscultation.   No wheezes, crackles, or rhonchi. No acute distress. ?Heart:  Regular rate and rhythm; no murmurs. ?Abdomen:  Soft, nondistended, nontender. No masses, hepatomegaly. No obvious masses.  Normal bowel .    ?Rectal:  Deferred   ?Msk:  Symmetrical without gross deformities.Marland Kitchen ?Pulses:  Normal pulses noted. ?Extremities:  Without edema. ?Neurologic:  Alert and  oriented x4;  grossly normal neurologically. ?Skin:  Intact without significant lesions or rashes. ?Cervical Nodes:  No significant cervical adenopathy. ?Psych:  Alert and cooperative. Normal mood and affect. ? ?Impression / Plan:  ? ?Personal history of sessile serrated and adenomatous colon polyps for surveillance  colonoscopy. ? ?Jahniah Pallas T. Fuller Plan  10/17/2021, 2:14 PM ?See Shea Evans, [facility], to contact our on call provider ? ? ?  ?

## 2021-10-17 NOTE — Progress Notes (Signed)
Report given to PACU, vss 

## 2021-10-17 NOTE — Patient Instructions (Signed)
Thank you for allowing [place] to care for you today. ?Resume previous diet and medications today. ?Return to normal daily activities tomorrow, 10/18/21. ?Recommend screening colonoscopy in 7 years. ? ? ? ? ?YOU HAD AN ENDOSCOPIC PROCEDURE TODAY AT [facility]:   Refer to the procedure report that was given to you for any specific questions about what was found during the examination.  If the procedure report does not answer your questions, please call your gastroenterologist to clarify.  If you requested that your care partner not be given the details of your procedure findings, then the procedure report has been included in a sealed envelope for you to review at your convenience later. ? ?YOU SHOULD EXPECT: Some feelings of bloating in the abdomen. Passage of more gas than usual.  Walking can help get rid of the air that was put into your GI tract during the procedure and reduce the bloating. If you had a lower endoscopy (such as a colonoscopy or flexible sigmoidoscopy) you may notice spotting of blood in your stool or on the toilet paper. If you underwent a bowel prep for your procedure, you may not have a normal bowel movement for a few days. ? ?Please Note:  You might notice some irritation and congestion in your nose or some drainage.  This is from the oxygen used during your procedure.  There is no need for concern and it should clear up in a day or so. ? ?SYMPTOMS TO REPORT IMMEDIATELY: ? ?Following lower endoscopy (colonoscopy or flexible sigmoidoscopy): ? Excessive amounts of blood in the stool ? Significant tenderness or worsening of abdominal pains ? Swelling of the abdomen that is new, acute ? Fever of 100?F or higher ? ? ? ?For urgent or emergent issues, a gastroenterologist can be reached at any hour by calling [phone]. ?Do not use MyChart messaging for urgent concerns.  ? ? ?DIET:  We do recommend a small meal at first, but then you may proceed to your regular diet.  Drink plenty of  fluids but you should avoid alcoholic beverages for 24 hours. ? ?ACTIVITY:  You should plan to take it easy for the rest of today and you should NOT DRIVE or use heavy machinery until tomorrow (because of the sedation medicines used during the test).   ? ?FOLLOW UP: ?Our staff will call the number listed on your records 48-72 hours following your procedure to check on you and address any questions or concerns that you may have regarding the information given to you following your procedure. If we do not reach you, we will leave a message.  We will attempt to reach you two times.  During this call, we will ask if you have developed any symptoms of COVID 19. If you develop any symptoms (ie: fever, flu-like symptoms, shortness of breath, cough etc.) before then, please call [phone].  If you test positive for Covid 19 in the 2 weeks post procedure, please call and report this information to [place].   ? ?If any biopsies were taken you will be contacted by phone or by letter within the next 1-3 weeks.  Please call us at [phone] if you have not heard about the biopsies in 3 weeks.  ? ? ?SIGNATURES/CONFIDENTIALITY: ?You and/or your care partner have signed paperwork which will be entered into your electronic medical record.  These signatures attest to the fact that that the information above on your After Visit Summary has been reviewed and is understood.  Full responsibility of the confidentiality of this discharge information lies with you and/or your care-partner.  ?

## 2021-10-17 NOTE — Op Note (Signed)
[place] ?Patient Name: Leonard Garrett ?Procedure Date: 10/17/2021 2:07 PM ?MRN: [id number] ?Endoscopist: Ladene Artist , MD ?Age: 56 ?Referring MD:  ?Date of Birth: Dec 27, 1965 ?Gender: Male ?Account #: [id number] ?Procedure:                Colonoscopy ?Indications:              Abnormal CT of the GI tract (sigmoid colon),  ?                          Personal history of sessile serrated and  ?                          adenomatous colon polyps. ?Medicines:                Monitored Anesthesia Care ?Procedure:                Pre-Anesthesia Assessment: ?                          - Prior to the procedure, a History and Physical  ?                          was performed, and patient medications and  ?                          allergies were reviewed. The patient's tolerance of  ?                          previous anesthesia was also reviewed. The risks  ?                          and benefits of the procedure and the sedation  ?                          options and risks were discussed with the patient.  ?                          All questions were answered, and informed consent  ?                          was obtained. Prior Anticoagulants: The patient has  ?                          taken no previous anticoagulant or antiplatelet  ?                          agents. ASA Grade Assessment: II - A patient with  ?                          mild systemic disease. After reviewing the risks  ?                          and benefits, the patient was deemed in  ?  satisfactory condition to undergo the procedure. ?                          After obtaining informed consent, the colonoscope  ?                          was passed under direct vision. Throughout the  ?                          procedure, the patient's blood pressure, pulse, and  ?                          oxygen saturations were monitored continuously. The  ?                          CF [id number] #[id number] was introduced through the  anus  ?                          and advanced to the the cecum, identified by  ?                          appendiceal orifice and ileocecal valve. The  ?                          ileocecal valve, appendiceal orifice, and rectum  ?                          were photographed. The quality of the bowel  ?                          preparation was good after extensive lavage,  ?                          suction. The colonoscopy was performed without  ?                          difficulty. The patient tolerated the procedure  ?                          well. ?Scope In: 2:22:35 PM ?Scope Out: 2:36:20 PM ?Scope Withdrawal Time: 0 hours 11 minutes 40 seconds  ?Total Procedure Duration: 0 hours 13 minutes 45 seconds  ?Findings:                 The perianal and digital rectal examinations were  ?                          normal. ?                          Multiple small-mouthed diverticula were found in  ?                          the left colon. There was evidence of diverticular  ?  spasm. Peri-diverticular erythema was seen. There  ?                          was no evidence of diverticular bleeding. ?                          Internal hemorrhoids were found during  ?                          retroflexion. The hemorrhoids were moderate and  ?                          Grade I (internal hemorrhoids that do not prolapse). ?                          The exam was otherwise without abnormality on  ?                          direct and retroflexion views. ?Complications:            No immediate complications. Estimated blood loss:  ?                          None. ?Estimated Blood Loss:     Estimated blood loss: none. ?Impression:               - Moderate diverticulosis in the left colon. ?                          - Internal hemorrhoids. ?                          - The examination was otherwise normal on direct  ?                          and retroflexion views. ?                          - No specimens  collected. ?Recommendation:           - Repeat colonoscopy in 7 years for surveillance. ?                          - Patient has a contact number available for  ?                          emergencies. The signs and symptoms of potential  ?                          delayed complications were discussed with the  ?                          patient. Return to normal activities tomorrow.  ?                          Written discharge instructions were provided to the  ?  patient. ?                          - High fiber diet. ?                          - Continue present medications. ?Ladene Artist, MD ?10/17/2021 2:40:33 PM ?This report has been signed electronically. ?

## 2021-10-19 ENCOUNTER — Telehealth: Payer: Self-pay | Admitting: *Deleted

## 2021-10-19 NOTE — Telephone Encounter (Signed)
?  Follow up Call- ? ?Call back number 10/17/2021  ?Post procedure Call Back phone  # [phone]  ?Permission to leave phone message Yes  ?Some recent data might be hidden  ?  ? ?Patient questions: ? ?Do you have a fever, pain , or abdominal swelling? No. ?Pain Score  0 * ? ?Have you tolerated food without any problems? Yes.   ? ?Have you been able to return to your normal activities? Yes.   ? ?Do you have any questions about your discharge instructions: ?Diet   No. ?Medications  No. ?Follow up visit  No. ? ?Do you have questions or concerns about your Care? No. ? ?Actions: ?* If pain score is 4 or above: ?No action needed, pain <4. ? ? ?

## 2021-10-31 DIAGNOSIS — H43812 Vitreous degeneration, left eye: Secondary | ICD-10-CM | POA: Diagnosis not present

## 2021-10-31 LAB — HM DIABETES EYE EXAM

## 2021-11-21 DIAGNOSIS — E119 Type 2 diabetes mellitus without complications: Secondary | ICD-10-CM | POA: Diagnosis not present

## 2021-11-21 DIAGNOSIS — H4312 Vitreous hemorrhage, left eye: Secondary | ICD-10-CM | POA: Diagnosis not present

## 2021-11-21 DIAGNOSIS — H43812 Vitreous degeneration, left eye: Secondary | ICD-10-CM | POA: Diagnosis not present

## 2021-11-21 DIAGNOSIS — H2513 Age-related nuclear cataract, bilateral: Secondary | ICD-10-CM | POA: Diagnosis not present

## 2022-01-11 ENCOUNTER — Other Ambulatory Visit: Payer: Self-pay | Admitting: Nurse Practitioner

## 2022-01-21 ENCOUNTER — Other Ambulatory Visit: Payer: Self-pay | Admitting: Family Medicine

## 2022-01-23 NOTE — Telephone Encounter (Signed)
Requested Prescriptions  Pending Prescriptions Disp Refills  . metFORMIN (GLUCOPHAGE) 500 MG tablet [Pharmacy Med Name: METFORMIN 500MG TABLETS] 180 tablet 0    Sig: TAKE 1 TABLET(500 MG) BY MOUTH TWICE DAILY WITH A MEAL     Endocrinology:  Diabetes - Biguanides Failed - 01/23/2022  8:20 AM      Failed - B12 Level in normal range and within 720 days    No results found for: "VITAMINB12"       Failed - Valid encounter within last 6 months    Recent Outpatient Visits          4 months ago Tubular adenoma of colon   [place] Susy Frizzle, MD   2 years ago Essential hypertension   [place], Warren T, MD   3 years ago Diabetes mellitus type II, non insulin dependent ([place])   [place] Pickard, [name], MD   4 years ago Diabetes mellitus type II, non insulin dependent ([place])   [place] Pickard, [name], MD   5 years ago Diabetes mellitus type II, non insulin dependent ([place])   [place] Pickard, [name], MD             Passed - Cr in normal range and within 360 days    Creat  Date Value Ref Range Status  09/02/2021 1.07 0.70 - 1.30 mg/dL Final         Passed - HBA1C is between 0 and 7.9 and within 180 days    Hgb A1c MFr Bld  Date Value Ref Range Status  09/02/2021 6.0 (H) <5.7 % of total Hgb Final    Comment:    For someone without known diabetes, a hemoglobin  A1c value between 5.7% and 6.4% is consistent with prediabetes and should be confirmed with a  follow-up test. . For someone with known diabetes, a value <7% indicates that their diabetes is well controlled. A1c targets should be individualized based on duration of diabetes, age, comorbid conditions, and other considerations. . This assay result is consistent with an increased risk of diabetes. . Currently, no consensus exists regarding use of hemoglobin A1c for diagnosis of diabetes for children. .           Passed - eGFR in normal range and within 360 days    GFR, Est African American  Date Value Ref Range Status  08/10/2017 92 > OR = 60 mL/min/1.41m Final   GFR, Est Non African American  Date Value Ref Range Status  08/10/2017 80 > OR = 60 mL/min/1.768mFinal   GFR, Estimated  Date Value Ref Range Status  08/03/2021 >60 >60 mL/min Final    Comment:    (NOTE) Calculated using the CKD-EPI Creatinine Equation (2021)          Passed - CBC within normal limits and completed in the last 12 months    WBC  Date Value Ref Range Status  09/02/2021 9.4 3.8 - 10.8 Thousand/uL Final   RBC  Date Value Ref Range Status  09/02/2021 4.50 4.20 - 5.80 Million/uL Final   Hemoglobin  Date Value Ref Range Status  09/02/2021 14.8 13.2 - 17.1 g/dL Final  08/03/2021 14.5 13.0 - 17.0 g/dL Final   HCT  Date Value Ref Range Status  09/02/2021 42.7 38.5 - 50.0 % Final   MCHC  Date Value Ref Range Status  09/02/2021 34.7 32.0 - 36.0 g/dL Final  [facility]  Date Value Ref Range Status  09/02/2021 32.9 27.0 - 33.0 pg Final   MCV  Date Value Ref Range Status  09/02/2021 94.9 80.0 - 100.0 fL Final   No results found for: "PLTCOUNTKUC", "LABPLAT", "POCPLA" RDW  Date Value Ref Range Status  09/02/2021 11.6 11.0 - 15.0 % Final

## 2022-02-08 ENCOUNTER — Inpatient Hospital Stay: Payer: BC Managed Care – PPO | Attending: Oncology

## 2022-02-08 ENCOUNTER — Ambulatory Visit (HOSPITAL_COMMUNITY)
Admission: RE | Admit: 2022-02-08 | Discharge: 2022-02-08 | Disposition: A | Discharge status: Home / Self Care | Payer: BC Managed Care – PPO | Source: Ambulatory Visit | Attending: Oncology | Admitting: Oncology

## 2022-02-08 ENCOUNTER — Other Ambulatory Visit: Payer: Self-pay

## 2022-02-08 DIAGNOSIS — Q7649 Other congenital malformations of spine, not associated with scoliosis: Secondary | ICD-10-CM | POA: Diagnosis not present

## 2022-02-08 DIAGNOSIS — C6292 Malignant neoplasm of left testis, unspecified whether descended or undescended: Secondary | ICD-10-CM | POA: Insufficient documentation

## 2022-02-08 DIAGNOSIS — I700 Atherosclerosis of aorta: Secondary | ICD-10-CM | POA: Diagnosis not present

## 2022-02-08 DIAGNOSIS — C6290 Malignant neoplasm of unspecified testis, unspecified whether descended or undescended: Secondary | ICD-10-CM | POA: Diagnosis not present

## 2022-02-08 DIAGNOSIS — R590 Localized enlarged lymph nodes: Secondary | ICD-10-CM | POA: Diagnosis not present

## 2022-02-08 DIAGNOSIS — K5730 Diverticulosis of large intestine without perforation or abscess without bleeding: Secondary | ICD-10-CM | POA: Diagnosis not present

## 2022-02-08 DIAGNOSIS — Z79899 Other long term (current) drug therapy: Secondary | ICD-10-CM | POA: Insufficient documentation

## 2022-02-08 DIAGNOSIS — N62 Hypertrophy of breast: Secondary | ICD-10-CM | POA: Diagnosis not present

## 2022-02-08 DIAGNOSIS — K6389 Other specified diseases of intestine: Secondary | ICD-10-CM | POA: Diagnosis not present

## 2022-02-08 LAB — CMP (CANCER CENTER ONLY)
ALT: 17 U/L (ref 0–44)
AST: 18 U/L (ref 15–41)
Albumin: 4.5 g/dL (ref 3.5–5.0)
Alkaline Phosphatase: 94 U/L (ref 38–126)
Anion gap: 5 (ref 5–15)
BUN: 19 mg/dL (ref 6–20)
CO2: 30 mmol/L (ref 22–32)
Calcium: 10.8 mg/dL — ABNORMAL HIGH (ref 8.9–10.3)
Chloride: 105 mmol/L (ref 98–111)
Creatinine: 1.05 mg/dL (ref 0.61–1.24)
GFR, Estimated: >60 mL/min (ref >60)
Glucose, Bld: 118 mg/dL — ABNORMAL HIGH (ref 70–99)
Potassium: 4.3 mmol/L (ref 3.5–5.1)
Sodium: 140 mmol/L (ref 135–145)
Total Bilirubin: 0.6 mg/dL (ref 0.3–1.2)
Total Protein: 7.5 g/dL (ref 6.5–8.1)

## 2022-02-08 LAB — CBC WITH DIFFERENTIAL (CANCER CENTER ONLY)
Abs Immature Granulocytes: 0.02 10*3/uL (ref 0.00–0.07)
Basophils Absolute: 0 10*3/uL (ref 0.0–0.1)
Basophils Relative: 1 %
Eosinophils Absolute: 0.2 10*3/uL (ref 0.0–0.5)
Eosinophils Relative: 3 %
HCT: 39.3 % (ref 39.0–52.0)
Hemoglobin: 13.8 g/dL (ref 13.0–17.0)
Immature Granulocytes: 0 %
Lymphocytes Relative: 21 %
Lymphs Abs: 1.3 10*3/uL (ref 0.7–4.0)
MCH: 32.2 pg (ref 26.0–34.0)
MCHC: 35.1 g/dL (ref 30.0–36.0)
MCV: 91.8 fL (ref 80.0–100.0)
Monocytes Absolute: 0.6 10*3/uL (ref 0.1–1.0)
Monocytes Relative: 9 %
Neutro Abs: 4.3 10*3/uL (ref 1.7–7.7)
Neutrophils Relative %: 66 %
Platelet Count: 233 10*3/uL (ref 150–400)
RBC: 4.28 MIL/uL (ref 4.22–5.81)
RDW: 13.1 % (ref 11.5–15.5)
WBC Count: 6.4 10*3/uL (ref 4.0–10.5)
nRBC: 0 % (ref 0.0–0.2)

## 2022-02-08 LAB — LACTATE DEHYDROGENASE: LDH: 145 U/L (ref 98–192)

## 2022-02-08 MED ORDER — IOHEXOL 300 MG/ML  SOLN
100.0000 mL | Freq: Once | INTRAMUSCULAR | Status: AC | PRN
  Administered 2022-02-08: 100 mL via INTRAVENOUS

## 2022-02-08 MED ORDER — SODIUM CHLORIDE (PF) 0.9 % IJ SOLN
INTRAMUSCULAR | Status: AC
  Filled 2022-02-08: qty 50

## 2022-02-09 LAB — AFP TUMOR MARKER: AFP, Serum, Tumor Marker: <1.8 ng/mL (ref 0.0–8.4)

## 2022-02-09 LAB — BETA HCG QUANT (REF LAB): hCG Quant: <1 m[IU]/mL (ref 0–3)

## 2022-02-15 ENCOUNTER — Other Ambulatory Visit: Payer: Self-pay

## 2022-02-15 ENCOUNTER — Inpatient Hospital Stay (HOSPITAL_BASED_OUTPATIENT_CLINIC_OR_DEPARTMENT_OTHER): Payer: BC Managed Care – PPO | Admitting: Oncology

## 2022-02-15 VITALS — BP 139/69 | HR 66 | Temp 97.7°F | Resp 16 | Ht 69.0 in | Wt 210.9 lb

## 2022-02-15 DIAGNOSIS — Z79899 Other long term (current) drug therapy: Secondary | ICD-10-CM | POA: Diagnosis not present

## 2022-02-15 DIAGNOSIS — R590 Localized enlarged lymph nodes: Secondary | ICD-10-CM | POA: Diagnosis not present

## 2022-02-15 DIAGNOSIS — I700 Atherosclerosis of aorta: Secondary | ICD-10-CM | POA: Diagnosis not present

## 2022-02-15 DIAGNOSIS — C6292 Malignant neoplasm of left testis, unspecified whether descended or undescended: Secondary | ICD-10-CM | POA: Diagnosis not present

## 2022-02-15 NOTE — Progress Notes (Signed)
Hematology and Oncology Follow Up Visit  Leonard Garrett [id number] [birth year] 56 y.o. 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   Prior Therapy: He underwent left orchiectomy on February 18, 2021.  The final pathology showed a 6.2 cm tumor that is 50% embryonal, 40% yolk sac and 10% teratoma.  Final pathological staging was T2 disease.   His a post orchiectomy alpha-fetoprotein was 4.4 with beta-hCG less than 1.  CT scan on February 25, 2020 showed no evidence of metastatic disease although there is a mildly enlarged right external iliac lymph node measuring 1.1 cm.  BEP chemotherapy started on 04/25/2021 and completed 1 cycle of therapy.  Current therapy: Active surveillance  Interim History: Leonard Garrett returns today for a follow-up visit.  Since the last visit, he reports of feeling well without any major complaints.  He denies any nausea, vomiting or residual complications related to chemotherapy.  He denies any worsening neuropathy or fatigue.  He denies any hospitalizations or illnesses.     Medications: Reviewed without changes. Current Outpatient Medications  Medication Sig Dispense Refill   amLODipine (NORVASC) 5 MG tablet TAKE 1 TABLET(5 MG) BY MOUTH DAILY 90 tablet 3   aspirin 81 MG tablet Take 81 mg by mouth daily.     irbesartan (AVAPRO) 150 MG tablet TAKE 2 TABLETS BY MOUTH EVERY NIGHT AT BEDTIME 60 tablet 11   metFORMIN (GLUCOPHAGE) 500 MG tablet TAKE 1 TABLET(500 MG) BY MOUTH TWICE DAILY WITH A MEAL 180 tablet 0   Multiple Vitamins-Minerals (MULTIVITAMIN PO) Take by mouth daily at 12 noon.     Omega-3 Fatty Acids (FISH OIL) 1200 MG CPDR Take by mouth daily.     No current facility-administered medications for this visit.     Allergies: No Known Allergies   Physical Exam:  Blood pressure 139/69, pulse 66, temperature 97.7 F  (36.5 C), temperature source Temporal, resp. rate 16, height '5\' 9"'$  (1.753 m), weight 210 lb 14.4 oz (95.7 kg), SpO2 100 %.   ECOG:  0     General appearance: Comfortable appearing without any discomfort Head: Normocephalic without any trauma Oropharynx: Mucous membranes are moist and pink without any thrush or ulcers. Eyes: Pupils are equal and round reactive to light. Lymph nodes: No cervical, supraclavicular, inguinal or axillary lymphadenopathy.   Heart:regular rate and rhythm.  S1 and S2 without leg edema. Lung: Clear without any rhonchi or wheezes.  No dullness to percussion. Abdomin: Soft, nontender, nondistended with good bowel sounds.  No hepatosplenomegaly. Musculoskeletal: No joint deformity or effusion.  Full range of motion noted. Neurological: No deficits noted on motor, sensory and deep tendon reflex exam. Skin: No petechial rash or dryness.  Appeared moist.        Lab Results: Lab Results  Component Value Date   WBC 6.4 02/08/2022   HGB 13.8 02/08/2022   HCT 39.3 02/08/2022   MCV 91.8 02/08/2022   PLT 233 02/08/2022   PSA 0.70 09/02/2021     Chemistry      Component Value Date/Time   NA 140 02/08/2022 1038   K 4.3 02/08/2022 1038   CL 105 02/08/2022 1038   CO2 30 02/08/2022 1038   BUN 19 02/08/2022 1038   CREATININE 1.05 02/08/2022 1038   CREATININE 1.07 09/02/2021 0811      Component Value Date/Time   CALCIUM 10.8 (H) 02/08/2022 1038   ALKPHOS  94 02/08/2022 1038   AST 18 02/08/2022 1038   ALT 17 02/08/2022 1038   BILITOT 0.6 02/08/2022 1038     IMPRESSION: 1. Stable right external iliac and bilateral inguinal lymph nodes without evidence of new or progressive disease in the chest, abdomen or pelvis. 2. Similar short segmental wall thickening of the sigmoid colon possibly reflecting chronic changes related to diverticulitis/ Segmental colitis associated with diverticulosis. However underlying mass lesion not excluded, consider correlation  with colonoscopy if not previously performed. 3.  Aortic Atherosclerosis (ICD10-I70.0).     Impression and Plan:   56 year old with:  1.  Testicular cancer diagnosed in July 2022.  He was found to have T2N0 left nonseminomatous tumor with 50% embryonal component with 40% yolk sac, 10% teratoma.    He is currently on active surveillance without any evidence of relapsed disease.  Imaging studies obtained on February 08, 2022 showed no evidence of relapsed disease.  He has mild inguinal lymph node enlargement which is stable.  Tumor markers are all within normal range.  I recommended imaging studies every 6 months for the next year and annually after that.  He is agreeable to proceed.    2.  Pulmonary function surveillance: No pulmonary complications noted after bleomycin exposure.   3.  Follow-up: He will return in 6 months for follow-up.   30  minutes were spent on this encounter.  The time was dedicated to reviewing laboratory data, disease status update, reviewing the imaging studies and outlining future plan of care.    Zola Button, MD 7/12/20239:28 AM [place]

## 2022-04-26 ENCOUNTER — Other Ambulatory Visit: Payer: Self-pay | Admitting: Family Medicine

## 2022-04-26 NOTE — Telephone Encounter (Signed)
Requested medication (s) are due for refill today: yes  Requested medication (s) are on the active medication list: yes  Last refill:  01/23/22 #180  Future visit scheduled: no  Notes to clinic:  called pt and LM on VM to call office to make appointment   Requested Prescriptions  Pending Prescriptions Disp Refills   metFORMIN (GLUCOPHAGE) 500 MG tablet [Pharmacy Med Name: METFORMIN 500MG TABLETS] 180 tablet 0    Sig: TAKE 1 TABLET(500 MG) BY MOUTH TWICE DAILY WITH A MEAL     Endocrinology:  Diabetes - Biguanides Failed - 04/26/2022  6:27 AM      Failed - HBA1C is between 0 and 7.9 and within 180 days    Hgb A1c MFr Bld  Date Value Ref Range Status  09/02/2021 6.0 (H) <5.7 % of total Hgb Final    Comment:    For someone without known diabetes, a hemoglobin  A1c value between 5.7% and 6.4% is consistent with prediabetes and should be confirmed with a  follow-up test. . For someone with known diabetes, a value <7% indicates that their diabetes is well controlled. A1c targets should be individualized based on duration of diabetes, age, comorbid conditions, and other considerations. . This assay result is consistent with an increased risk of diabetes. . Currently, no consensus exists regarding use of hemoglobin A1c for diagnosis of diabetes for children. .          Failed - B12 Level in normal range and within 720 days    No results found for: "VITAMINB12"       Failed - Valid encounter within last 6 months    Recent Outpatient Visits           7 months ago Tubular adenoma of colon   [place] Susy Frizzle, MD   2 years ago Essential hypertension   [place], Warren T, MD   4 years ago Diabetes mellitus type II, non insulin dependent ([place])   [place] Pickard, [name], MD   4 years ago Diabetes mellitus type II, non insulin dependent ([place])   [place] Pickard, [name], MD   5  years ago Diabetes mellitus type II, non insulin dependent ([place])   [place] Pickard, [name], MD              Passed - Cr in normal range and within 360 days    Creatinine  Date Value Ref Range Status  02/08/2022 1.05 0.61 - 1.24 mg/dL Final   Creat  Date Value Ref Range Status  09/02/2021 1.07 0.70 - 1.30 mg/dL Final         Passed - eGFR in normal range and within 360 days    GFR, Est African American  Date Value Ref Range Status  08/10/2017 92 > OR = 60 mL/min/1.34m Final   GFR, Est Non African American  Date Value Ref Range Status  08/10/2017 80 > OR = 60 mL/min/1.748mFinal   GFR, Estimated  Date Value Ref Range Status  02/08/2022 >60 >60 mL/min Final    Comment:    (NOTE) Calculated using the CKD-EPI Creatinine Equation (2021)          Passed - CBC within normal limits and completed in the last 12 months    WBC  Date Value Ref Range Status  09/02/2021 9.4 3.8 - 10.8 Thousand/uL Final   WBC Count  Date Value Ref Range  Status  02/08/2022 6.4 4.0 - 10.5 K/uL Final   RBC  Date Value Ref Range Status  02/08/2022 4.28 4.22 - 5.81 MIL/uL Final   Hemoglobin  Date Value Ref Range Status  02/08/2022 13.8 13.0 - 17.0 g/dL Final   HCT  Date Value Ref Range Status  02/08/2022 39.3 39.0 - 52.0 % Final   MCHC  Date Value Ref Range Status  02/08/2022 35.1 30.0 - 36.0 g/dL Final   [facility]  Date Value Ref Range Status  02/08/2022 32.2 26.0 - 34.0 pg Final   MCV  Date Value Ref Range Status  02/08/2022 91.8 80.0 - 100.0 fL Final   No results found for: "PLTCOUNTKUC", "LABPLAT", "POCPLA" RDW  Date Value Ref Range Status  02/08/2022 13.1 11.5 - 15.5 % Final

## 2022-05-22 DIAGNOSIS — Z8669 Personal history of other diseases of the nervous system and sense organs: Secondary | ICD-10-CM | POA: Diagnosis not present

## 2022-05-22 DIAGNOSIS — H40003 Preglaucoma, unspecified, bilateral: Secondary | ICD-10-CM | POA: Diagnosis not present

## 2022-06-08 DIAGNOSIS — H401231 Low-tension glaucoma, bilateral, mild stage: Secondary | ICD-10-CM | POA: Diagnosis not present

## 2022-06-08 DIAGNOSIS — H2513 Age-related nuclear cataract, bilateral: Secondary | ICD-10-CM | POA: Diagnosis not present

## 2022-07-03 DIAGNOSIS — H04129 Dry eye syndrome of unspecified lacrimal gland: Secondary | ICD-10-CM | POA: Diagnosis not present

## 2022-07-03 DIAGNOSIS — H40003 Preglaucoma, unspecified, bilateral: Secondary | ICD-10-CM | POA: Diagnosis not present

## 2022-07-03 DIAGNOSIS — E119 Type 2 diabetes mellitus without complications: Secondary | ICD-10-CM | POA: Diagnosis not present

## 2022-07-03 DIAGNOSIS — H25813 Combined forms of age-related cataract, bilateral: Secondary | ICD-10-CM | POA: Diagnosis not present

## 2022-07-04 DIAGNOSIS — H25813 Combined forms of age-related cataract, bilateral: Secondary | ICD-10-CM | POA: Diagnosis not present

## 2022-07-04 DIAGNOSIS — Z8669 Personal history of other diseases of the nervous system and sense organs: Secondary | ICD-10-CM | POA: Diagnosis not present

## 2022-07-14 DIAGNOSIS — H25811 Combined forms of age-related cataract, right eye: Secondary | ICD-10-CM | POA: Diagnosis not present

## 2022-07-20 ENCOUNTER — Telehealth: Payer: Self-pay | Admitting: Oncology

## 2022-07-20 NOTE — Telephone Encounter (Signed)
Rescheduled 01/19 appointment with new provider, patient declined transferring to [place]. Patient is notified.

## 2022-07-21 ENCOUNTER — Other Ambulatory Visit: Payer: Self-pay | Admitting: Family Medicine

## 2022-07-21 NOTE — Telephone Encounter (Signed)
Requested Prescriptions  Pending Prescriptions Disp Refills   amLODipine (NORVASC) 5 MG tablet [Pharmacy Med Name: AMLODIPINE BESYLATE '5MG'$  TABLETS] 90 tablet 0    Sig: TAKE 1 TABLET(5 MG) BY MOUTH DAILY     Cardiovascular: Calcium Channel Blockers 2 Failed - 07/21/2022  6:32 AM      Failed - Valid encounter within last 6 months    Recent Outpatient Visits           10 months ago Tubular adenoma of colon   [place] Susy Frizzle, MD   3 years ago Essential hypertension   [place], Warren T, MD   4 years ago Diabetes mellitus type II, non insulin dependent ([place])   [place] Susy Frizzle, MD   4 years ago Diabetes mellitus type II, non insulin dependent ([place])   [place] Susy Frizzle, MD   5 years ago Diabetes mellitus type II, non insulin dependent ([place])   [place] Pickard, [name], MD              Passed - Last BP in normal range    BP Readings from Last 1 Encounters:  02/15/22 139/69         Passed - Last Heart Rate in normal range    Pulse Readings from Last 1 Encounters:  02/15/22 66

## 2022-08-18 ENCOUNTER — Other Ambulatory Visit: Payer: Self-pay

## 2022-08-18 ENCOUNTER — Inpatient Hospital Stay: Payer: BC Managed Care – PPO | Attending: Oncology

## 2022-08-18 ENCOUNTER — Ambulatory Visit (HOSPITAL_COMMUNITY)
Admission: RE | Admit: 2022-08-18 | Discharge: 2022-08-18 | Disposition: A | Discharge status: Home / Self Care | Payer: BC Managed Care – PPO | Source: Ambulatory Visit | Attending: Oncology | Admitting: Oncology

## 2022-08-18 DIAGNOSIS — K5730 Diverticulosis of large intestine without perforation or abscess without bleeding: Secondary | ICD-10-CM | POA: Diagnosis not present

## 2022-08-18 DIAGNOSIS — E669 Obesity, unspecified: Secondary | ICD-10-CM | POA: Insufficient documentation

## 2022-08-18 DIAGNOSIS — K760 Fatty (change of) liver, not elsewhere classified: Secondary | ICD-10-CM | POA: Diagnosis not present

## 2022-08-18 DIAGNOSIS — I700 Atherosclerosis of aorta: Secondary | ICD-10-CM | POA: Diagnosis not present

## 2022-08-18 DIAGNOSIS — C6292 Malignant neoplasm of left testis, unspecified whether descended or undescended: Secondary | ICD-10-CM | POA: Diagnosis not present

## 2022-08-18 DIAGNOSIS — Z820 Family history of epilepsy and other diseases of the nervous system: Secondary | ICD-10-CM | POA: Insufficient documentation

## 2022-08-18 DIAGNOSIS — Z79899 Other long term (current) drug therapy: Secondary | ICD-10-CM | POA: Diagnosis not present

## 2022-08-18 DIAGNOSIS — Z9079 Acquired absence of other genital organ(s): Secondary | ICD-10-CM | POA: Insufficient documentation

## 2022-08-18 LAB — CMP (CANCER CENTER ONLY)
ALT: 18 U/L (ref 0–44)
AST: 20 U/L (ref 15–41)
Albumin: 4.3 g/dL (ref 3.5–5.0)
Alkaline Phosphatase: 70 U/L (ref 38–126)
Anion gap: 7 (ref 5–15)
BUN: 17 mg/dL (ref 6–20)
CO2: 28 mmol/L (ref 22–32)
Calcium: 10.2 mg/dL (ref 8.9–10.3)
Chloride: 102 mmol/L (ref 98–111)
Creatinine: 0.95 mg/dL (ref 0.61–1.24)
GFR, Estimated: >60 mL/min (ref >60)
Glucose, Bld: 82 mg/dL (ref 70–99)
Potassium: 3.9 mmol/L (ref 3.5–5.1)
Sodium: 137 mmol/L (ref 135–145)
Total Bilirubin: 0.6 mg/dL (ref 0.3–1.2)
Total Protein: 7.3 g/dL (ref 6.5–8.1)

## 2022-08-18 LAB — CBC WITH DIFFERENTIAL (CANCER CENTER ONLY)
Abs Immature Granulocytes: 0.01 10*3/uL (ref 0.00–0.07)
Basophils Absolute: 0.1 10*3/uL (ref 0.0–0.1)
Basophils Relative: 1 %
Eosinophils Absolute: 0.1 10*3/uL (ref 0.0–0.5)
Eosinophils Relative: 2 %
HCT: 36.8 % — ABNORMAL LOW (ref 39.0–52.0)
Hemoglobin: 13.7 g/dL (ref 13.0–17.0)
Immature Granulocytes: 0 %
Lymphocytes Relative: 30 %
Lymphs Abs: 2 10*3/uL (ref 0.7–4.0)
MCH: 33.7 pg (ref 26.0–34.0)
MCHC: 37.2 g/dL — ABNORMAL HIGH (ref 30.0–36.0)
MCV: 90.6 fL (ref 80.0–100.0)
Monocytes Absolute: 0.7 10*3/uL (ref 0.1–1.0)
Monocytes Relative: 10 %
Neutro Abs: 3.8 10*3/uL (ref 1.7–7.7)
Neutrophils Relative %: 57 %
Platelet Count: 247 10*3/uL (ref 150–400)
RBC: 4.06 MIL/uL — ABNORMAL LOW (ref 4.22–5.81)
RDW: 11.8 % (ref 11.5–15.5)
WBC Count: 6.6 10*3/uL (ref 4.0–10.5)
nRBC: 0 % (ref 0.0–0.2)

## 2022-08-18 LAB — LACTATE DEHYDROGENASE: LDH: 124 U/L (ref 98–192)

## 2022-08-18 MED ORDER — IOHEXOL 300 MG/ML  SOLN
100.0000 mL | Freq: Once | INTRAMUSCULAR | Status: AC | PRN
  Administered 2022-08-18: 100 mL via INTRAVENOUS

## 2022-08-18 MED ORDER — SODIUM CHLORIDE (PF) 0.9 % IJ SOLN
INTRAMUSCULAR | Status: AC
  Filled 2022-08-18: qty 50

## 2022-08-22 LAB — AFP TUMOR MARKER: AFP, Serum, Tumor Marker: <1.8 ng/mL (ref 0.0–8.4)

## 2022-08-22 LAB — BETA HCG QUANT (REF LAB): hCG Quant: <1 m[IU]/mL (ref 0–3)

## 2022-08-25 ENCOUNTER — Encounter: Payer: Self-pay | Admitting: Hematology and Oncology

## 2022-08-25 ENCOUNTER — Other Ambulatory Visit: Payer: Self-pay

## 2022-08-25 ENCOUNTER — Inpatient Hospital Stay (HOSPITAL_BASED_OUTPATIENT_CLINIC_OR_DEPARTMENT_OTHER): Payer: BC Managed Care – PPO | Admitting: Hematology and Oncology

## 2022-08-25 ENCOUNTER — Ambulatory Visit: Payer: BC Managed Care – PPO | Admitting: Oncology

## 2022-08-25 VITALS — BP 141/69 | HR 72 | Temp 97.8°F | Resp 18 | Ht 69.0 in | Wt 215.8 lb

## 2022-08-25 DIAGNOSIS — K5730 Diverticulosis of large intestine without perforation or abscess without bleeding: Secondary | ICD-10-CM | POA: Diagnosis not present

## 2022-08-25 DIAGNOSIS — K760 Fatty (change of) liver, not elsewhere classified: Secondary | ICD-10-CM | POA: Diagnosis not present

## 2022-08-25 DIAGNOSIS — C6292 Malignant neoplasm of left testis, unspecified whether descended or undescended: Secondary | ICD-10-CM

## 2022-08-25 DIAGNOSIS — Z9079 Acquired absence of other genital organ(s): Secondary | ICD-10-CM | POA: Diagnosis not present

## 2022-08-25 DIAGNOSIS — E669 Obesity, unspecified: Secondary | ICD-10-CM | POA: Diagnosis not present

## 2022-08-25 DIAGNOSIS — Z79899 Other long term (current) drug therapy: Secondary | ICD-10-CM | POA: Diagnosis not present

## 2022-08-25 DIAGNOSIS — Z820 Family history of epilepsy and other diseases of the nervous system: Secondary | ICD-10-CM | POA: Diagnosis not present

## 2022-08-25 NOTE — Assessment & Plan Note (Signed)
We discussed importance of risk factor modification, lifestyle changes and dietary modification

## 2022-08-25 NOTE — Assessment & Plan Note (Signed)
We reviewed test results, tumor marker and CT imaging He has no signs of disease recurrence I will see him again in 6 months repeat blood work, tumor marker and CT imaging

## 2022-08-25 NOTE — Progress Notes (Signed)
[place] FOLLOW-UP progress notes  Patient Care Team: Susy Frizzle, MD as PCP - General (Family Medicine)  CHIEF COMPLAINTS/PURPOSE OF VISIT:  History of testicular cancer, for further evaluation and follow-up  HISTORY OF PRESENTING ILLNESS:  Leonard Garrett 57 y.o. male was transferred to my care after his prior physician has left.  This patient is seen due to history of testicular cancer status post orchiectomy and adjuvant treatment He is not symptomatic  I reviewed the patient's records extensive and collaborated the history with the patient. 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[place])   04/25/2021 - 05/10/2021 Chemotherapy   Patient is on Treatment Plan :  TESTICULAR BEP q21d X 1 Cycle      08/25/2022 Cancer Staging   Staging form: Testis, AJCC 7th Edition - Clinical stage from 08/25/2022: pT2, N0, M0 - Signed by Heath Lark, MD on 08/25/2022 Staged by: Managing physician Stage prefix: Initial diagnosis Laterality: Left Biopsy of metastatic site performed: No     MEDICAL HISTORY:  Past Medical History:  Diagnosis Date   Cancer ([place]) 05/2021   left testicular cancer   History of fatty infiltration of liver 2017   Hyperlipidemia    Hypertension    followed by pcp   Mass of left testis    Seasonal allergies    Type 2 diabetes mellitus ([place])    followed by pcp----  (02-15-2021  pt stated does not check blood surgar at home)   Wears glasses      SURGICAL HISTORY: Past Surgical History:  Procedure Laterality Date   CLOSED REDUCTION FOREARM FRACTURE Left 1978   COLONOSCOPY  10/29/2014   Dr.Stark   KNEE ARTHROSCOPY Right 2011   ORCHIECTOMY Left 02/18/2021   Procedure: INGUINAL RADICAL ORCHIECTOMY;  Surgeon: Robley Fries, MD;  Location: [facility];  Service: Urology;  Laterality: Left;   RETINAL DETACHMENT SURGERY Right 2011   TONSILLECTOMY  1970    SOCIAL HISTORY: Social History   Socioeconomic History   Marital status: Widowed    Spouse name: Not on file   Number of children: 2   Years of education: Not on file   Highest education level: Not on file  Occupational History   Occupation: Chief Financial Officer  Tobacco Use   Smoking status: Never   Smokeless tobacco: Never  Vaping Use   Vaping Use: Never used  Substance and Sexual Activity   Alcohol use: Not Currently   Drug use: Never   Sexual activity: Not on file    Comment: VASECTOMY  199  Other Topics Concern   Not on file  Social History Narrative   Not on file   Social Determinants of Health   Financial Resource Strain: Not on file  Food Insecurity: Not on file  Transportation Needs: Not on file  Physical Activity: Not on file  Stress: Not on file  Social Connections: Not on file  Intimate Partner Violence: Not on file    FAMILY HISTORY: Family History  Problem Relation Age of Onset   Colon cancer Neg Hx    Esophageal cancer Neg Hx    Rectal cancer Neg Hx    Stomach cancer Neg Hx    ALS Father     ALLERGIES:  has No Known Allergies.  MEDICATIONS:  Current Outpatient Medications  Medication Sig Dispense Refill   metFORMIN (GLUCOPHAGE) 500 MG tablet TAKE 1 TABLET(500 MG) BY MOUTH TWICE DAILY WITH A MEAL 180 tablet 0   amLODipine (NORVASC) 5 MG tablet TAKE 1 TABLET(5 MG) BY MOUTH DAILY 90 tablet 0   aspirin 81 MG tablet Take 81 mg by mouth daily.     irbesartan (AVAPRO) 150 MG tablet TAKE 2 TABLETS BY MOUTH EVERY NIGHT AT  BEDTIME 60 tablet 11   Multiple Vitamins-Minerals (MULTIVITAMIN PO) Take by mouth daily at 12 noon.     Omega-3 Fatty Acids (FISH OIL) 1200 MG CPDR Take by mouth daily.     No current facility-administered medications for this visit.    REVIEW OF SYSTEMS:   Constitutional: Denies fevers, chills or abnormal night sweats Eyes: Denies blurriness of vision, double vision or watery eyes Ears, nose, mouth, throat, and face: Denies mucositis or sore throat Respiratory: Denies cough, dyspnea or wheezes Cardiovascular: Denies palpitation, chest discomfort or lower extremity swelling Gastrointestinal:  Denies nausea, heartburn or change in bowel habits Skin: Denies abnormal skin rashes Lymphatics: Denies new lymphadenopathy or easy bruising Neurological:Denies numbness, tingling or new weaknesses Behavioral/Psych: Mood is stable, no new changes  All other systems were reviewed with the patient and are negative.  PHYSICAL EXAMINATION: ECOG PERFORMANCE STATUS: 0 - Asymptomatic  Vitals:   08/25/22 0916  BP: (!) 141/69  Pulse: 72  Resp: 18  Temp: 97.8 F (36.6 C)  SpO2: 100%   Filed Weights   08/25/22 0916  Weight: 215 lb 12.8 oz (97.9 kg)    GENERAL:alert, no distress and comfortable NEURO: no focal motor/sensory deficits  LABORATORY DATA:  I have reviewed the data as listed Lab Results  Component Value Date   WBC 6.6 08/18/2022   HGB 13.7 08/18/2022   HCT 36.8 (L) 08/18/2022   MCV 90.6 08/18/2022   PLT 247 08/18/2022   Recent Labs    09/02/21 0811 02/08/22 1038 08/18/22 1305  NA 139 140 137  K 4.5 4.3 3.9  CL 103 105 102  CO2 '29 30 28  '$ GLUCOSE 111* 118* 82  BUN '14 19 17  '$ CREATININE 1.07 1.05 0.95  CALCIUM 10.5* 10.8* 10.2  GFRNONAA  --  >60 >60  PROT 7.0 7.5 7.3  ALBUMIN  --  4.5 4.3  AST '20 18 20  '$ ALT '21 17 18  '$ ALKPHOS  --  94 70  BILITOT 0.3 0.6 0.6    RADIOGRAPHIC STUDIES: I have reviewed imaging studies with the patient I have personally reviewed the  radiological images as listed and agreed with the findings in the report. CT CHEST ABDOMEN PELVIS W CONTRAST  Result Date: 08/18/2022 CLINICAL DATA:  Left non-seminomatous testicular carcinoma. Assess treatment response. * Tracking Code: BO * EXAM: CT CHEST, ABDOMEN, AND PELVIS WITH CONTRAST TECHNIQUE: Multidetector CT imaging of the chest, abdomen and pelvis was performed following the standard protocol during bolus administration of intravenous contrast. RADIATION DOSE REDUCTION: This exam was performed according to the departmental dose-optimization program which includes automated exposure control, adjustment of the mA and/or kV according to patient size and/or use of iterative reconstruction technique. CONTRAST:  179m OMNIPAQUE IOHEXOL 300 MG/ML  SOLN COMPARISON:  02/08/2022 FINDINGS: CT CHEST FINDINGS Cardiovascular: No acute findings. Aortic atherosclerotic calcification incidentally noted. Mediastinum/Lymph Nodes: No masses or pathologically enlarged lymph nodes identified. Lungs/Pleura: No suspicious pulmonary nodules or masses identified. No evidence of infiltrate or pleural effusion. Musculoskeletal:  No suspicious bone lesions identified. CT ABDOMEN AND PELVIS FINDINGS Hepatobiliary: No masses identified. Gallbladder is unremarkable. No evidence of biliary ductal dilatation. Pancreas:  No mass or inflammatory changes. Spleen:  Within normal limits in size and appearance. Adrenals/Urinary tract: No suspicious masses or hydronephrosis. Unremarkable unopacified urinary bladder. Stomach/Bowel: No evidence of obstruction, inflammatory process, or abnormal fluid collections. Diverticulosis is seen mainly involving the sigmoid colon, however there is no evidence of diverticulitis. Normal appendix visualized. Vascular/Lymphatic: No pathologically enlarged lymph nodes identified. No acute vascular findings. Aortic atherosclerotic calcification incidentally noted. Reproductive: Prior left orchiectomy again  noted. No mass or other significant abnormality identified. Other:  None. Musculoskeletal:  No suspicious bone lesions identified. IMPRESSION: Stable exam. No evidence of metastatic disease within the chest, abdomen, or pelvis. Colonic diverticulosis, without radiographic evidence of diverticulitis. Aortic Atherosclerosis (ICD10-I70.0). Electronically Signed   By: Marlaine Hind M.D.   On: 08/18/2022 16:32    ASSESSMENT & PLAN:  Malignant neoplasm of left testis (HCC) We reviewed test results, tumor marker and CT imaging He has no signs of disease recurrence I will see him again in 6 months repeat blood work, tumor marker and CT imaging  Obesity, Class I, BMI 30-34.9 We discussed importance of risk factor modification, lifestyle changes and dietary modification  Orders Placed This Encounter  Procedures   CT CHEST ABDOMEN PELVIS W CONTRAST    Standing Status:   Future    Standing Expiration Date:   08/26/2023    Order Specific Question:   Preferred imaging location?    Answer:   [facility]    Order Specific Question:   Radiology Contrast Protocol - do NOT remove file path    Answer:   [email or web address]    All questions were answered. The patient knows to call the clinic with any problems, questions or concerns. The total time spent in the appointment was 30 minutes encounter with patients including review of chart and various tests results, discussions about plan of care and coordination of care plan   Heath Lark, MD 08/25/2022 1:17 PM

## 2022-09-21 ENCOUNTER — Other Ambulatory Visit: Payer: Self-pay | Admitting: Family Medicine

## 2022-09-21 NOTE — Telephone Encounter (Signed)
Unable to refill per protocol, Rx request is too soon. Last refill 07/21/22 for 90 days. Patient needs OV.  Requested Prescriptions  Pending Prescriptions Disp Refills   amLODipine (NORVASC) 5 MG tablet [Pharmacy Med Name: AMLODIPINE BESYLATE 5MG TABLETS] 90 tablet 0    Sig: TAKE 1 TABLET(5 MG) BY MOUTH DAILY     Cardiovascular: Calcium Channel Blockers 2 Failed - 09/21/2022  1:29 PM      Failed - Last BP in normal range    BP Readings from Last 1 Encounters:  08/25/22 (!) 141/69         Failed - Valid encounter within last 6 months    Recent Outpatient Visits           1 year ago Tubular adenoma of colon   [place] Susy Frizzle, MD   3 years ago Essential hypertension   [place], Warren T, MD   4 years ago Diabetes mellitus type II, non insulin dependent ([place])   [place] Susy Frizzle, MD   5 years ago Diabetes mellitus type II, non insulin dependent ([place])   [place] Susy Frizzle, MD   5 years ago Diabetes mellitus type II, non insulin dependent ([place])   [place] Pickard, [name], MD              Passed - Last Heart Rate in normal range    Pulse Readings from Last 1 Encounters:  08/25/22 72

## 2022-09-25 ENCOUNTER — Encounter: Payer: Self-pay | Admitting: Family Medicine

## 2022-09-25 ENCOUNTER — Other Ambulatory Visit: Payer: Self-pay

## 2022-09-25 DIAGNOSIS — I10 Essential (primary) hypertension: Secondary | ICD-10-CM

## 2022-09-25 DIAGNOSIS — R7303 Prediabetes: Secondary | ICD-10-CM

## 2022-09-25 DIAGNOSIS — E785 Hyperlipidemia, unspecified: Secondary | ICD-10-CM

## 2022-09-25 DIAGNOSIS — E669 Obesity, unspecified: Secondary | ICD-10-CM

## 2022-09-25 MED ORDER — AMLODIPINE BESYLATE 5 MG PO TABS: ORAL_TABLET | ORAL | 0 refills | Status: DC

## 2022-09-25 MED ORDER — IRBESARTAN 150 MG PO TABS: 300.0000 mg | ORAL_TABLET | Freq: Every day | ORAL | 0 refills | Status: DC

## 2022-09-25 MED ORDER — METFORMIN HCL 500 MG PO TABS: ORAL_TABLET | ORAL | 0 refills | Status: DC

## 2022-09-26 ENCOUNTER — Other Ambulatory Visit: Payer: Self-pay | Admitting: Family Medicine

## 2022-09-26 DIAGNOSIS — E785 Hyperlipidemia, unspecified: Secondary | ICD-10-CM

## 2022-09-26 DIAGNOSIS — I10 Essential (primary) hypertension: Secondary | ICD-10-CM

## 2022-09-26 DIAGNOSIS — R7303 Prediabetes: Secondary | ICD-10-CM

## 2022-09-26 DIAGNOSIS — E669 Obesity, unspecified: Secondary | ICD-10-CM

## 2022-09-26 MED ORDER — METFORMIN HCL 500 MG PO TABS
ORAL_TABLET | ORAL | 0 refills | Status: DC
Start: 2022-09-26 — End: 2022-11-15

## 2022-09-26 MED ORDER — IRBESARTAN 150 MG PO TABS: 300.0000 mg | ORAL_TABLET | Freq: Every day | ORAL | 0 refills | Status: DC

## 2022-09-26 MED ORDER — AMLODIPINE BESYLATE 5 MG PO TABS: ORAL_TABLET | ORAL | 0 refills | Status: DC

## 2022-09-29 ENCOUNTER — Other Ambulatory Visit: Payer: Self-pay | Admitting: Family Medicine

## 2022-09-29 DIAGNOSIS — E785 Hyperlipidemia, unspecified: Secondary | ICD-10-CM

## 2022-09-29 DIAGNOSIS — R7303 Prediabetes: Secondary | ICD-10-CM

## 2022-09-29 DIAGNOSIS — E669 Obesity, unspecified: Secondary | ICD-10-CM

## 2022-09-29 NOTE — Telephone Encounter (Signed)
Rx 09/26/22 #180- to [facility] location as requested- no more RF allowed Requested Prescriptions  Pending Prescriptions Disp Refills   metFORMIN (GLUCOPHAGE) 500 MG tablet [Pharmacy Med Name: METFORMIN '500MG'$  TABLETS] 180 tablet 0    Sig: TAKE 1 TABLET(500 MG) BY MOUTH TWICE DAILY WITH A MEAL     Endocrinology:  Diabetes - Biguanides Failed - 09/29/2022 11:47 AM      Failed - HBA1C is between 0 and 7.9 and within 180 days    Hgb A1c MFr Bld  Date Value Ref Range Status  09/02/2021 6.0 (H) <5.7 % of total Hgb Final    Comment:    For someone without known diabetes, a hemoglobin  A1c value between 5.7% and 6.4% is consistent with prediabetes and should be confirmed with a  follow-up test. . For someone with known diabetes, a value <7% indicates that their diabetes is well controlled. A1c targets should be individualized based on duration of diabetes, age, comorbid conditions, and other considerations. . This assay result is consistent with an increased risk of diabetes. . Currently, no consensus exists regarding use of hemoglobin A1c for diagnosis of diabetes for children. .          Failed - B12 Level in normal range and within 720 days    No results found for: "VITAMINB12"       Failed - Valid encounter within last 6 months    Recent Outpatient Visits           1 year ago Tubular adenoma of colon   [place] [name], [name], MD   3 years ago Essential hypertension   [place], Warren T, MD   4 years ago Diabetes mellitus type II, non insulin dependent ([place])   [place] Susy Frizzle, MD   5 years ago Diabetes mellitus type II, non insulin dependent ([place])   [place] Susy Frizzle, MD   5 years ago Diabetes mellitus type II, non insulin dependent ([place])   [place] Pickard, [name], MD              Failed - CBC within normal limits and completed in  the last 12 months    WBC  Date Value Ref Range Status  09/02/2021 9.4 3.8 - 10.8 Thousand/uL Final   WBC Count  Date Value Ref Range Status  08/18/2022 6.6 4.0 - 10.5 K/uL Final   RBC  Date Value Ref Range Status  08/18/2022 4.06 (L) 4.22 - 5.81 MIL/uL Final   Hemoglobin  Date Value Ref Range Status  08/18/2022 13.7 13.0 - 17.0 g/dL Final   HCT  Date Value Ref Range Status  08/18/2022 36.8 (L) 39.0 - 52.0 % Final   MCHC  Date Value Ref Range Status  08/18/2022 37.2 (H) 30.0 - 36.0 g/dL Final   [facility]  Date Value Ref Range Status  08/18/2022 33.7 26.0 - 34.0 pg Final   MCV  Date Value Ref Range Status  08/18/2022 90.6 80.0 - 100.0 fL Final   No results found for: "PLTCOUNTKUC", "LABPLAT", "POCPLA" RDW  Date Value Ref Range Status  08/18/2022 11.8 11.5 - 15.5 % Final         Passed - Cr in normal range and within 360 days    Creatinine  Date Value Ref Range Status  08/18/2022 0.95 0.61 - 1.24 mg/dL Final   Creat  Date Value Ref Range Status  09/02/2021  1.07 0.70 - 1.30 mg/dL Final         Passed - eGFR in normal range and within 360 days    GFR, Est African American  Date Value Ref Range Status  08/10/2017 92 > OR = 60 mL/min/1.72m Final   GFR, Est Non African American  Date Value Ref Range Status  08/10/2017 80 > OR = 60 mL/min/1.736mFinal   GFR, Estimated  Date Value Ref Range Status  08/18/2022 >60 >60 mL/min Final    Comment:    (NOTE) Calculated using the CKD-EPI Creatinine Equation (2021)

## 2022-10-06 ENCOUNTER — Other Ambulatory Visit: Payer: Self-pay | Admitting: Family Medicine

## 2022-10-06 DIAGNOSIS — R7303 Prediabetes: Secondary | ICD-10-CM

## 2022-10-06 DIAGNOSIS — I10 Essential (primary) hypertension: Secondary | ICD-10-CM

## 2022-10-06 DIAGNOSIS — E785 Hyperlipidemia, unspecified: Secondary | ICD-10-CM

## 2022-10-06 DIAGNOSIS — E669 Obesity, unspecified: Secondary | ICD-10-CM

## 2022-11-15 ENCOUNTER — Other Ambulatory Visit: Payer: Self-pay

## 2022-11-15 ENCOUNTER — Encounter: Payer: Self-pay | Admitting: Family Medicine

## 2022-11-15 DIAGNOSIS — E669 Obesity, unspecified: Secondary | ICD-10-CM

## 2022-11-15 DIAGNOSIS — E785 Hyperlipidemia, unspecified: Secondary | ICD-10-CM

## 2022-11-15 DIAGNOSIS — I10 Essential (primary) hypertension: Secondary | ICD-10-CM

## 2022-11-15 DIAGNOSIS — R7303 Prediabetes: Secondary | ICD-10-CM

## 2022-11-15 MED ORDER — IRBESARTAN 150 MG PO TABS: 300.0000 mg | ORAL_TABLET | Freq: Every day | ORAL | 0 refills | Status: AC

## 2022-11-15 MED ORDER — METFORMIN HCL 500 MG PO TABS: ORAL_TABLET | ORAL | 0 refills | Status: AC

## 2022-12-14 DIAGNOSIS — H04129 Dry eye syndrome of unspecified lacrimal gland: Secondary | ICD-10-CM | POA: Diagnosis not present

## 2022-12-14 DIAGNOSIS — Z961 Presence of intraocular lens: Secondary | ICD-10-CM | POA: Diagnosis not present

## 2022-12-14 DIAGNOSIS — H401231 Low-tension glaucoma, bilateral, mild stage: Secondary | ICD-10-CM | POA: Diagnosis not present

## 2022-12-14 DIAGNOSIS — E119 Type 2 diabetes mellitus without complications: Secondary | ICD-10-CM | POA: Diagnosis not present

## 2022-12-21 ENCOUNTER — Other Ambulatory Visit: Payer: Self-pay | Admitting: Family Medicine

## 2022-12-21 DIAGNOSIS — I10 Essential (primary) hypertension: Secondary | ICD-10-CM

## 2022-12-21 DIAGNOSIS — E669 Obesity, unspecified: Secondary | ICD-10-CM

## 2022-12-21 DIAGNOSIS — R7303 Prediabetes: Secondary | ICD-10-CM

## 2022-12-21 DIAGNOSIS — E785 Hyperlipidemia, unspecified: Secondary | ICD-10-CM

## 2022-12-21 NOTE — Telephone Encounter (Signed)
Requested Prescriptions  Pending Prescriptions Disp Refills   irbesartan (AVAPRO) 150 MG tablet [Pharmacy Med Name: IRBESARTAN 150MG  TABLETS] 180 tablet 0    Sig: TAKE 2 TABLETS(300 MG) BY MOUTH AT BEDTIME     Cardiovascular:  Angiotensin Receptor Blockers Failed - 12/21/2022  5:14 PM      Failed - Last BP in normal range    BP Readings from Last 1 Encounters:  08/25/22 (!) 141/69         Failed - Valid encounter within last 6 months    Recent Outpatient Visits           1 year ago Tubular adenoma of colon   [facility] Donita Brooks, MD   3 years ago Essential hypertension   [facility] Donita Brooks, MD   4 years ago Diabetes mellitus type II, non insulin dependent (HCC)   [facility] Donita Brooks, MD   5 years ago Diabetes mellitus type II, non insulin dependent (HCC)   [facility] Donita Brooks, MD   6 years ago Diabetes mellitus type II, non insulin dependent (HCC)   [facility] Pickard, Priscille Heidelberg, MD              Passed - Cr in normal range and within 180 days    Creatinine  Date Value Ref Range Status  08/18/2022 0.95 0.61 - 1.24 mg/dL Final   Creat  Date Value Ref Range Status  09/02/2021 1.07 0.70 - 1.30 mg/dL Final         Passed - K in normal range and within 180 days    Potassium  Date Value Ref Range Status  08/18/2022 3.9 3.5 - 5.1 mmol/L Final         Passed - Patient is not pregnant

## 2022-12-22 ENCOUNTER — Other Ambulatory Visit: Payer: Self-pay | Admitting: Family Medicine

## 2022-12-22 DIAGNOSIS — R7303 Prediabetes: Secondary | ICD-10-CM

## 2022-12-22 DIAGNOSIS — I10 Essential (primary) hypertension: Secondary | ICD-10-CM

## 2022-12-22 DIAGNOSIS — E669 Obesity, unspecified: Secondary | ICD-10-CM

## 2022-12-22 NOTE — Telephone Encounter (Signed)
Reordered 12/22/22 #30 called req. Pharmacy and med is there  Requested Prescriptions  Refused Prescriptions Disp Refills   amLODipine (NORVASC) 5 MG tablet [Pharmacy Med Name: AMLODIPINE BESYLATE 5MG  TABLETS] 90 tablet     Sig: TAKE 1 TABLET(5 MG) BY MOUTH DAILY     Cardiovascular: Calcium Channel Blockers 2 Failed - 12/22/2022 11:33 AM      Failed - Last BP in normal range    BP Readings from Last 1 Encounters:  08/25/22 (!) 141/69         Failed - Valid encounter within last 6 months    Recent Outpatient Visits           1 year ago Tubular adenoma of colon   [facility] Donita Brooks, MD   3 years ago Essential hypertension   [facility] Donita Brooks, MD   4 years ago Diabetes mellitus type II, non insulin dependent (HCC)   [facility] Donita Brooks, MD   5 years ago Diabetes mellitus type II, non insulin dependent (HCC)   [facility] Donita Brooks, MD   6 years ago Diabetes mellitus type II, non insulin dependent (HCC)   [facility] Pickard, Priscille Heidelberg, MD              Passed - Last Heart Rate in normal range    Pulse Readings from Last 1 Encounters:  08/25/22 72

## 2023-01-16 IMAGING — CT CT CHEST-ABD-PELV W/ CM
2 of 5 series · 13 of 36 positions shown, 15 images · IV contrast (omnipaque)
Comparison: CT February 24, 2021 and ultrasound February 14, 2021

CLINICAL DATA: History of testicular cancer status post left
orchiectomy. Chemotherapy complete. Restaging

EXAM:
CT CHEST, ABDOMEN, AND PELVIS WITH CONTRAST
TECHNIQUE: Multidetector CT imaging of the chest, abdomen and pelvis was
performed following the standard protocol during bolus
administration of intravenous contrast.
CONTRAST:  75mL OMNIPAQUE IOHEXOL 350 MG/ML SOLN

[Series 2: cap with · axial · 0.89mm/px · z∈[-598,-48]mm · 10 of 136 slices shown, 12 images]
[im 13/136  mediastinal]
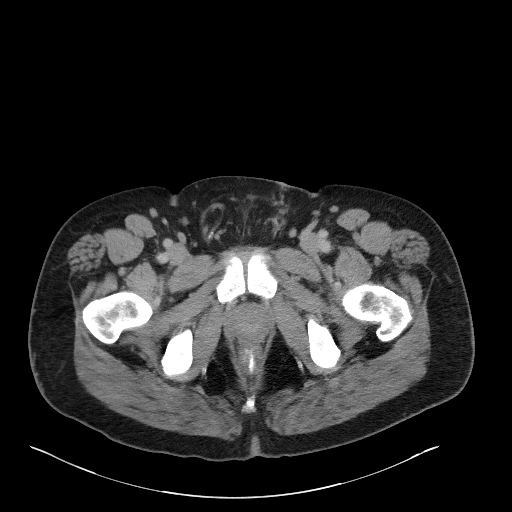
[im 13/136  bone]
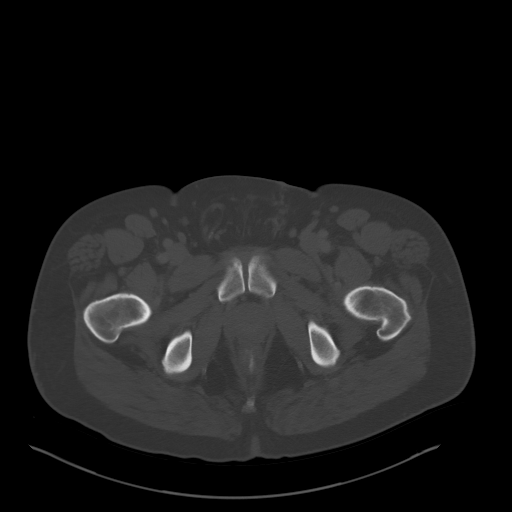
[im 25/136  mediastinal]
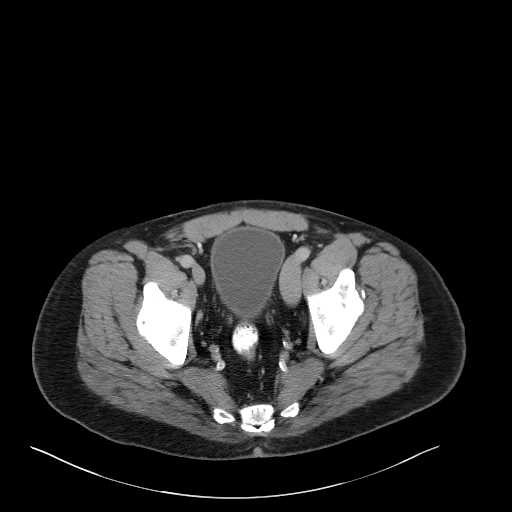
[im 37/136  mediastinal]
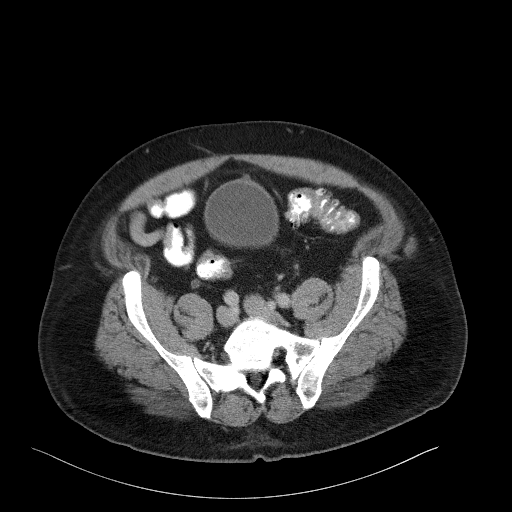
[im 50/136  mediastinal]
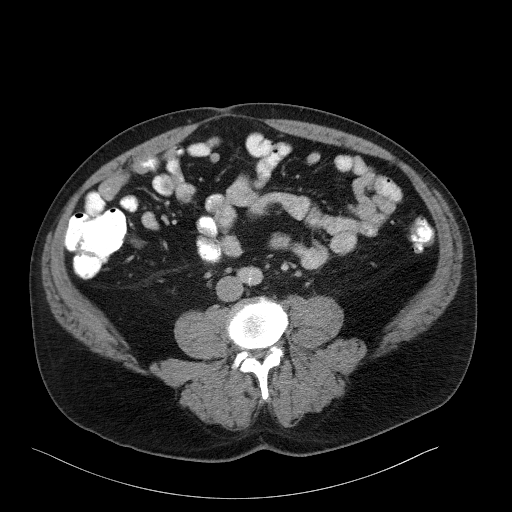
[im 62/136  mediastinal]
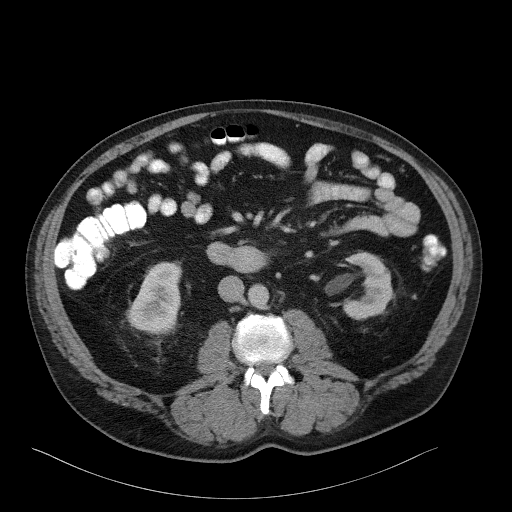
[im 74/136  mediastinal]
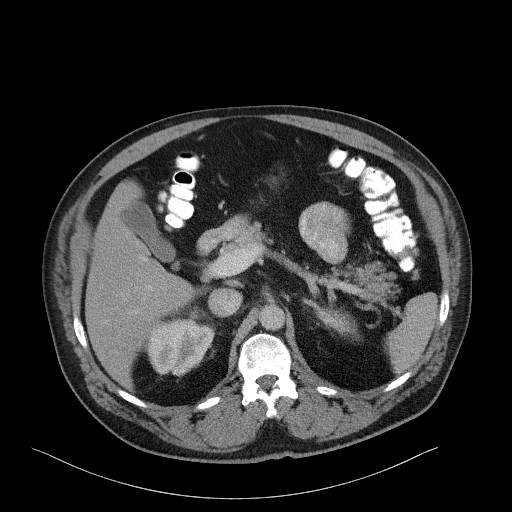
[im 86/136  mediastinal]
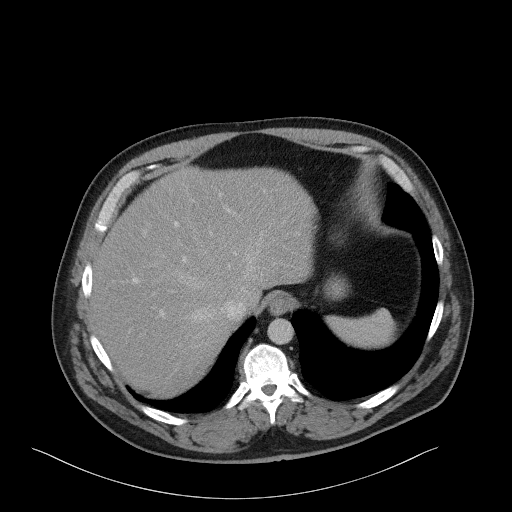
[im 99/136  mediastinal]
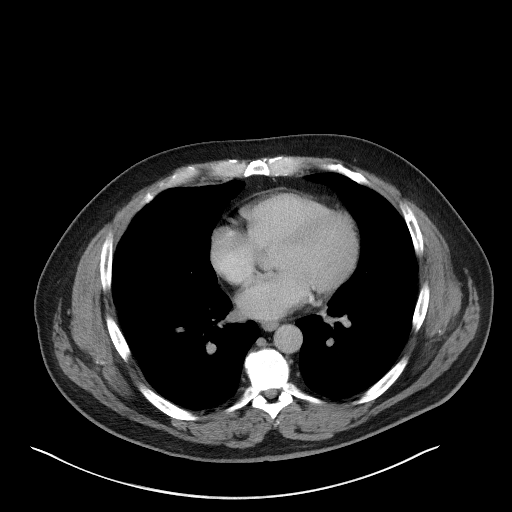
[im 111/136  mediastinal]
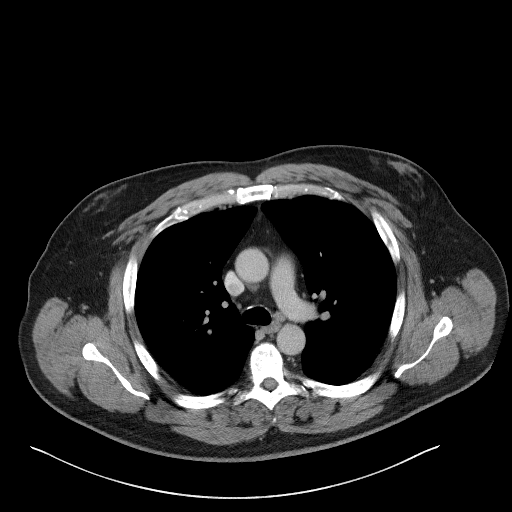
[im 111/136  bone]
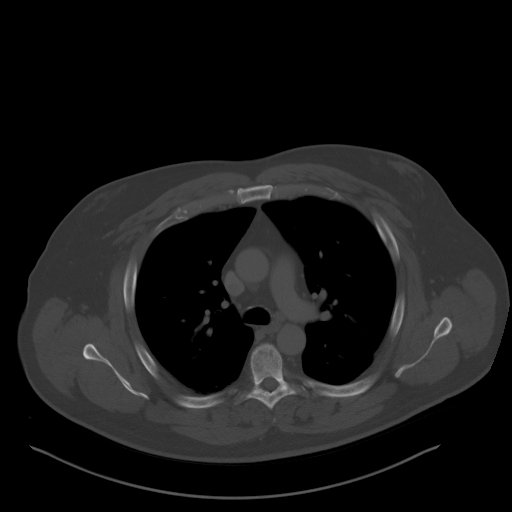
[im 123/136  mediastinal]
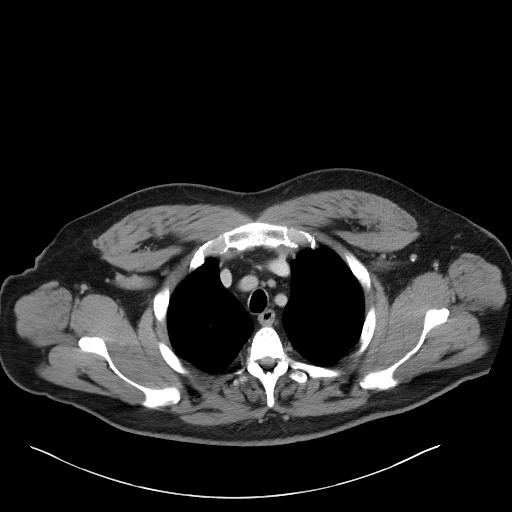

[Series 5: coronals · coronal · 1.05mm/px · 3 of 151 slices shown]
[im 31/151  mediastinal]
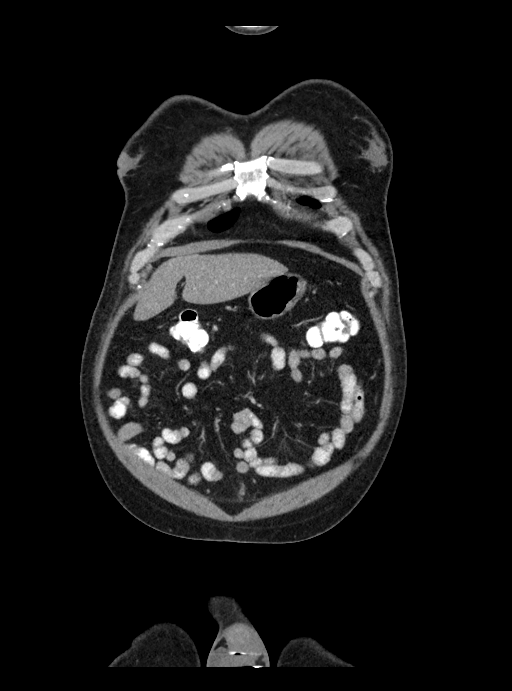
[im 61/151  mediastinal]
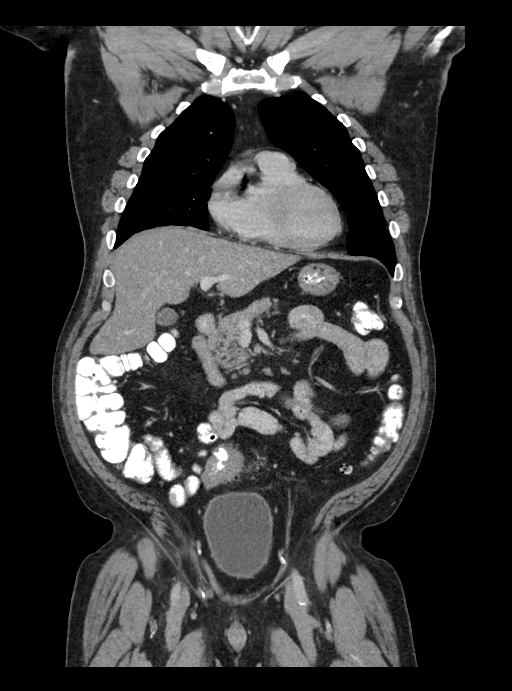
[im 91/151  mediastinal]
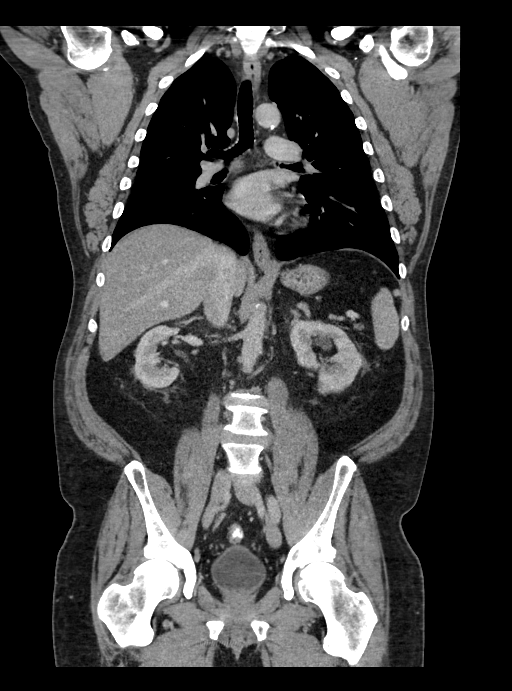

[13 of 36 positions shown; findings below may reference images not displayed]

FINDINGS: CT CHEST FINDINGS

Cardiovascular: Aortic atherosclerosis without aneurysmal dilation.
No central pulmonary embolus on this nondedicated study. Normal size
heart. No significant pericardial effusion/thickening.

Mediastinum/Nodes: No supraclavicular adenopathy. No discrete
thyroid nodularity. No pathologically enlarged mediastinal, hilar or
axillary lymph nodes. The trachea esophagus are unremarkable.

Lungs/Pleura: New 3 mm right lower lobe pulmonary nodule on image
107/4 with a few adjacent 1-2 mm or smaller pulmonary nodules for
instance 1 anterior to the dominant nodule also on image 107/4 pain
no pleural effusion. No pneumothorax.

Musculoskeletal: Degenerative changes bilateral acromioclavicular
joints. Thoracic spondylosis. Bilateral gynecomastia. No aggressive
lytic or blastic lesion of bone.

CT ABDOMEN PELVIS FINDINGS

Hepatobiliary: Diffuse hepatic steatosis. No suspicious hepatic
lesion. Gallbladder is unremarkable. No biliary ductal dilation.

Pancreas: No pancreatic ductal dilation or evidence of acute
inflammation.

Spleen: Normal in size without focal abnormality.

Adrenals/Urinary Tract: Bilateral adrenal glands appear normal. No
hydronephrosis. No solid enhancing renal mass. Urinary bladder is
unremarkable for degree of distension.

Stomach/Bowel: Enteric contrast material traverses the rectum.
Stomach is predominantly decompressed limiting evaluation the wall.
No pathologic dilation of small or large bowel. The appendix and
terminal ileum appear normal. Colonic diverticulosis without
findings of acute diverticulitis. There is short segment wall
thickening of the sigmoid colon for instance on image 93/2 which
appears similar to prior and is at least in part related to under
distension however alternate etiology such as Segmental colitis
associated with diverticulosis or underlying mass lesion are not
excluded.

Vascular/Lymphatic: Scattered aortic atherosclerosis without
abdominal aortic aneurysm. Interval decrease in size of the right
external iliac lymph node now measuring 8 mm on image 117/2
previously 11 mm. Bilateral inguinal lymph nodes are similar prior
and within normal size limits. Prominent pelvic and retroperitoneal
lymph nodes appears similar to prior and are not pathologically
enlarged by size criteria.

Reproductive: Dystrophic prostatic calcifications. Prior left
orchiectomy.

Other: No significant abdominopelvic free fluid. No
pneumoperitoneum.

Musculoskeletal: Transitional S1 vertebra. Multilevel degenerative
changes spine with multilevel Schmorl's node formation. Mild
degenerative changes bilateral hips. Aspherical femoral head osseous
morphology which may predispose to femoroacetabular impingement
(KIFLAY) in the appropriate clinical setting .
IMPRESSION: 1. Interval decrease in size of the right external iliac lymph node.
No evidence of new or progressive disease in the abdomen or pelvis.
2. New tiny adjacent pulmonary nodules in the dependent right lower
lobe measuring up to 3 mm, which are nonspecific and favored
infectious or inflammatory. However, metastatic disease is not
entirely excluded. Recommend attention on close interval three-month
follow-up dedicated chest CT.
3. Short segment wall thickening of the sigmoid colon, at least in
part related to under distension however etiologies such as
Segmental colitis associated with diverticulosis or underlying mass
lesion are not excluded. Consider correlation with colonoscopy.
4. Diffuse hepatic steatosis.
5.  Aortic Atherosclerosis ([id number]).

## 2023-01-17 DIAGNOSIS — E1165 Type 2 diabetes mellitus with hyperglycemia: Secondary | ICD-10-CM | POA: Insufficient documentation

## 2023-02-16 ENCOUNTER — Other Ambulatory Visit: Payer: Self-pay

## 2023-02-16 ENCOUNTER — Ambulatory Visit (HOSPITAL_COMMUNITY)
Admission: RE | Admit: 2023-02-16 | Discharge: 2023-02-16 | Disposition: A | Discharge status: Home / Self Care | Payer: BC Managed Care – PPO | Source: Ambulatory Visit | Attending: Hematology and Oncology | Admitting: Hematology and Oncology

## 2023-02-16 ENCOUNTER — Inpatient Hospital Stay: Payer: BC Managed Care – PPO | Attending: Hematology and Oncology

## 2023-02-16 DIAGNOSIS — I700 Atherosclerosis of aorta: Secondary | ICD-10-CM | POA: Diagnosis not present

## 2023-02-16 DIAGNOSIS — R7303 Prediabetes: Secondary | ICD-10-CM | POA: Insufficient documentation

## 2023-02-16 DIAGNOSIS — Z7984 Long term (current) use of oral hypoglycemic drugs: Secondary | ICD-10-CM | POA: Insufficient documentation

## 2023-02-16 DIAGNOSIS — Z79899 Other long term (current) drug therapy: Secondary | ICD-10-CM | POA: Insufficient documentation

## 2023-02-16 DIAGNOSIS — C6292 Malignant neoplasm of left testis, unspecified whether descended or undescended: Secondary | ICD-10-CM | POA: Insufficient documentation

## 2023-02-16 DIAGNOSIS — K5730 Diverticulosis of large intestine without perforation or abscess without bleeding: Secondary | ICD-10-CM | POA: Insufficient documentation

## 2023-02-16 DIAGNOSIS — Z9079 Acquired absence of other genital organ(s): Secondary | ICD-10-CM | POA: Insufficient documentation

## 2023-02-16 DIAGNOSIS — I2510 Atherosclerotic heart disease of native coronary artery without angina pectoris: Secondary | ICD-10-CM | POA: Diagnosis not present

## 2023-02-16 LAB — CBC WITH DIFFERENTIAL (CANCER CENTER ONLY)
Abs Immature Granulocytes: 0.04 10*3/uL (ref 0.00–0.07)
Basophils Absolute: 0.1 10*3/uL (ref 0.0–0.1)
Basophils Relative: 1 %
Eosinophils Absolute: 0.2 10*3/uL (ref 0.0–0.5)
Eosinophils Relative: 3 %
HCT: 37.2 % — ABNORMAL LOW (ref 39.0–52.0)
Hemoglobin: 13 g/dL (ref 13.0–17.0)
Immature Granulocytes: 1 %
Lymphocytes Relative: 28 %
Lymphs Abs: 1.8 10*3/uL (ref 0.7–4.0)
MCH: 33.2 pg (ref 26.0–34.0)
MCHC: 34.9 g/dL (ref 30.0–36.0)
MCV: 95.1 fL (ref 80.0–100.0)
Monocytes Absolute: 0.7 10*3/uL (ref 0.1–1.0)
Monocytes Relative: 10 %
Neutro Abs: 3.8 10*3/uL (ref 1.7–7.7)
Neutrophils Relative %: 57 %
Platelet Count: 220 10*3/uL (ref 150–400)
RBC: 3.91 MIL/uL — ABNORMAL LOW (ref 4.22–5.81)
RDW: 12.7 % (ref 11.5–15.5)
WBC Count: 6.5 10*3/uL (ref 4.0–10.5)
nRBC: 0 % (ref 0.0–0.2)

## 2023-02-16 LAB — CMP (CANCER CENTER ONLY)
ALT: 21 U/L (ref 0–44)
AST: 20 U/L (ref 15–41)
Albumin: 4.3 g/dL (ref 3.5–5.0)
Alkaline Phosphatase: 85 U/L (ref 38–126)
Anion gap: 6 (ref 5–15)
BUN: 20 mg/dL (ref 6–20)
CO2: 29 mmol/L (ref 22–32)
Calcium: 9.8 mg/dL (ref 8.9–10.3)
Chloride: 102 mmol/L (ref 98–111)
Creatinine: 1.1 mg/dL (ref 0.61–1.24)
GFR, Estimated: >60 mL/min (ref >60)
Glucose, Bld: 133 mg/dL — ABNORMAL HIGH (ref 70–99)
Potassium: 3.9 mmol/L (ref 3.5–5.1)
Sodium: 137 mmol/L (ref 135–145)
Total Bilirubin: 0.5 mg/dL (ref 0.3–1.2)
Total Protein: 7.4 g/dL (ref 6.5–8.1)

## 2023-02-16 LAB — LACTATE DEHYDROGENASE: LDH: 126 U/L (ref 98–192)

## 2023-02-16 MED ORDER — IOHEXOL 300 MG/ML  SOLN
100.0000 mL | Freq: Once | INTRAMUSCULAR | Status: AC | PRN
  Administered 2023-02-16: 100 mL via INTRAVENOUS

## 2023-02-16 MED ORDER — SODIUM CHLORIDE (PF) 0.9 % IJ SOLN
INTRAMUSCULAR | Status: AC
  Filled 2023-02-16: qty 50

## 2023-02-18 LAB — AFP TUMOR MARKER: AFP, Serum, Tumor Marker: <1.8 ng/mL (ref 0.0–8.4)

## 2023-02-19 LAB — BETA HCG QUANT (REF LAB): hCG Quant: <1 m[IU]/mL (ref 0–3)

## 2023-02-23 ENCOUNTER — Encounter: Payer: Self-pay | Admitting: Hematology and Oncology

## 2023-02-23 ENCOUNTER — Inpatient Hospital Stay (HOSPITAL_BASED_OUTPATIENT_CLINIC_OR_DEPARTMENT_OTHER): Payer: BC Managed Care – PPO | Admitting: Hematology and Oncology

## 2023-02-23 ENCOUNTER — Other Ambulatory Visit: Payer: Self-pay

## 2023-02-23 VITALS — BP 157/82 | HR 85 | Temp 99.0°F | Resp 18 | Ht 69.0 in | Wt 222.0 lb

## 2023-02-23 DIAGNOSIS — C6292 Malignant neoplasm of left testis, unspecified whether descended or undescended: Secondary | ICD-10-CM | POA: Diagnosis not present

## 2023-02-23 DIAGNOSIS — Z7984 Long term (current) use of oral hypoglycemic drugs: Secondary | ICD-10-CM | POA: Diagnosis not present

## 2023-02-23 DIAGNOSIS — R7303 Prediabetes: Secondary | ICD-10-CM

## 2023-02-23 DIAGNOSIS — Z79899 Other long term (current) drug therapy: Secondary | ICD-10-CM | POA: Diagnosis not present

## 2023-02-23 DIAGNOSIS — I700 Atherosclerosis of aorta: Secondary | ICD-10-CM | POA: Diagnosis not present

## 2023-02-23 DIAGNOSIS — I2510 Atherosclerotic heart disease of native coronary artery without angina pectoris: Secondary | ICD-10-CM | POA: Diagnosis not present

## 2023-02-23 DIAGNOSIS — Z9079 Acquired absence of other genital organ(s): Secondary | ICD-10-CM | POA: Diagnosis not present

## 2023-02-23 DIAGNOSIS — K5730 Diverticulosis of large intestine without perforation or abscess without bleeding: Secondary | ICD-10-CM | POA: Diagnosis not present

## 2023-02-23 NOTE — Assessment & Plan Note (Signed)
I have reviewed labs and imaging He has no signs of recurrence I will see him in 6 months with labs only I will order CT imaging once a year

## 2023-02-23 NOTE — Progress Notes (Signed)
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   All questions were answered. The patient knows to call the clinic with any problems, questions or concerns. The total time spent in the appointment was 30 minutes encounter with patients including review of chart and various tests results, discussions about plan of care and coordination of care plan   Artis Delay, MD 02/23/2023 12:39 PM  INTERVAL HISTORY: Please see below for problem oriented charting. he returns for surveillance He has questions related to his blood work and reduced rbc count We review labs and CT  REVIEW OF SYSTEMS:   Constitutional: Denies fevers, chills or abnormal weight loss Eyes: Denies blurriness of vision Ears, nose, mouth, throat, and face: Denies mucositis or sore throat Respiratory: Denies cough, dyspnea or wheezes Cardiovascular: Denies palpitation, chest discomfort or lower extremity swelling Gastrointestinal:  Denies nausea, heartburn or change in bowel habits Skin: Denies abnormal skin rashes Lymphatics: Denies new lymphadenopathy or easy bruising Neurological:Denies numbness, tingling or new weaknesses Behavioral/Psych: Mood is stable, no new changes  All other systems were reviewed with the patient and are negative.  I have reviewed the past medical history, past surgical history, social history and family history with the patient and they are unchanged from previous note.  ALLERGIES:  has No Known Allergies.  MEDICATIONS:  Current Outpatient Medications  Medication Sig Dispense Refill    amLODipine (NORVASC) 5 MG tablet TAKE 1 TABLET(5 MG) BY MOUTH DAILY 30 tablet 0   aspirin 81 MG tablet Take 81 mg by mouth daily.     irbesartan (AVAPRO) 150 MG tablet Take 2 tablets (300 mg total) by mouth at bedtime. 180 tablet 0   metFORMIN (GLUCOPHAGE) 500 MG tablet TAKE 1 TABLET(500 MG) BY MOUTH TWICE DAILY WITH A MEAL 180 tablet 0   Multiple Vitamins-Minerals (MULTIVITAMIN PO) Take by mouth daily at 12 noon.     Omega-3 Fatty Acids (FISH OIL) 1200 MG CPDR Take by mouth daily.     No current facility-administered medications for this visit.    SUMMARY OF ONCOLOGIC HISTORY: Oncology History  Testis cancer (HCC)  03/24/2021 Initial Diagnosis   Testis cancer (HCC)   03/24/2021 Cancer Staging   Staging form: Testis, AJCC 7th Edition - Clinical: Stage IB (pT2, N0, M0, S0) - Signed by Benjiman Core, MD on 03/24/2021 Biopsy of metastatic site performed: No Lymph-vascular invasion (LVI): LVI present/identified, NOS Residual tumor (R): R0 - None Radical orchiectomy performed: Yes   Malignant neoplasm of left testis (HCC)  03/28/2021 Initial Diagnosis   Malignant neoplasm of left testis (HCC)   04/25/2021 - 05/10/2021 Chemotherapy   Patient is on Treatment Plan :  TESTICULAR BEP q21d X 1 Cycle      08/25/2022 Cancer Staging   Staging form: Testis, AJCC 7th Edition - Clinical stage from 08/25/2022: pT2, N0, M0 - Signed by Artis Delay, MD on 08/25/2022 Staged by: Managing physician Stage prefix: Initial diagnosis Laterality: Left Biopsy of metastatic site performed: No   02/19/2023 Imaging   CT CHEST ABDOMEN PELVIS W CONTRAST  Result Date:  02/16/2023 CLINICAL DATA:  Testicular cancer restaging, status post orchiectomy and adjuvant therapy * Tracking Code: BO * EXAM: CT CHEST, ABDOMEN, AND PELVIS WITH CONTRAST TECHNIQUE: Multidetector CT imaging of the chest, abdomen and pelvis was performed following the standard protocol during bolus administration of intravenous contrast. RADIATION DOSE  REDUCTION: This exam was performed according to the departmental dose-optimization program which includes automated exposure control, adjustment of the mA and/or kV according to patient size and/or use of iterative reconstruction technique. CONTRAST:  OMNIPAQUE IOHEXOL 300 MG/ML  SOLN COMPARISON:  08/18/2022 FINDINGS: CT CHEST FINDINGS Cardiovascular: Aortic atherosclerosis. Normal heart size. Left coronary artery calcifications. No pericardial effusion. Mediastinum/Nodes: No enlarged mediastinal, hilar, or axillary lymph nodes. Thyroid gland, trachea, and esophagus demonstrate no significant findings. Lungs/Pleura: Lungs are clear. No pleural effusion or pneumothorax. Musculoskeletal: No chest wall abnormality. No acute osseous findings. CT ABDOMEN PELVIS FINDINGS Hepatobiliary: No solid liver abnormality is seen. Contracted gallbladder. No gallstones, gallbladder wall thickening, or biliary dilatation. Pancreas: Unremarkable. No pancreatic ductal dilatation or surrounding inflammatory changes. Spleen: Normal in size without significant abnormality. Adrenals/Urinary Tract: Adrenal glands are unremarkable. Kidneys are normal, without renal calculi, solid lesion, or hydronephrosis. Bladder is unremarkable. Stomach/Bowel: Stomach is within normal limits. Appendix appears normal. No evidence of bowel wall thickening, distention, or inflammatory changes. Descending and sigmoid diverticulosis. Circumferential wall thickening and fat stranding about the mid sigmoid, somewhat increased compared to prior examination (series 2, image 97). Moderate burden of stool throughout the colon. Vascular/Lymphatic: Aortic atherosclerosis. No enlarged abdominal or pelvic lymph nodes. Reproductive: Status post left orchiectomy. Other: No abdominal wall hernia or abnormality. No ascites. Musculoskeletal: No acute osseous findings. IMPRESSION: 1. Status post left orchiectomy. No evidence of lymphadenopathy or metastatic disease in  the chest, abdomen, or pelvis. 2. Descending and sigmoid diverticulosis. Circumferential wall thickening and fat stranding about the mid sigmoid, somewhat increased compared to prior examination. Findings suggest mild acute diverticulitis superimposed upon chronic sequelae. Correlate for acutely referable symptoms. No evidence of perforation or abscess. 3. Coronary artery disease. Aortic Atherosclerosis (ICD10-I70.0). Electronically Signed   By: Jearld Lesch M.D.   On: 02/16/2023 16:28        PHYSICAL EXAMINATION: ECOG PERFORMANCE STATUS: 0 - Asymptomatic  Vitals:   02/23/23 1106  BP: (!) 157/82  Pulse: 85  Resp: 18  Temp: 99 F (37.2 C)  SpO2: 100%   Filed Weights   02/23/23 1106  Weight: 222 lb (100.7 kg)    GENERAL:alert, no distress and comfortable NEURO: alert & oriented x 3 with fluent speech, no focal motor/sensory deficits  LABORATORY DATA:  I have reviewed the data as listed    Component Value Date/Time   NA 137 02/16/2023 1059   K 3.9 02/16/2023 1059   CL 102 02/16/2023 1059   CO2 29 02/16/2023 1059   GLUCOSE 133 (H) 02/16/2023 1059   BUN 20 02/16/2023 1059   CREATININE 1.10 02/16/2023 1059   CREATININE 1.07 09/02/2021 0811   CALCIUM 9.8 02/16/2023 1059   PROT 7.4 02/16/2023 1059   ALBUMIN 4.3 02/16/2023 1059   AST 20 02/16/2023 1059   ALT 21 02/16/2023 1059   ALKPHOS 85 02/16/2023 1059   BILITOT 0.5 02/16/2023 1059   GFRNONAA >60 02/16/2023 1059   GFRNONAA 80 08/10/2017 1007   GFRAA 92 08/10/2017 1007    No results found for: "SPEP", "UPEP"  Lab Results  Component Value Date   WBC 6.5 02/16/2023   NEUTROABS 3.8 02/16/2023   HGB 13.0 02/16/2023  HCT 37.2 (L) 02/16/2023   MCV 95.1 02/16/2023   PLT 220 02/16/2023      Chemistry      Component Value Date/Time   NA 137 02/16/2023 1059   K 3.9 02/16/2023 1059   CL 102 02/16/2023 1059   CO2 29 02/16/2023 1059   BUN 20 02/16/2023 1059   CREATININE 1.10 02/16/2023 1059   CREATININE 1.07  09/02/2021 0811      Component Value Date/Time   CALCIUM 9.8 02/16/2023 1059   ALKPHOS 85 02/16/2023 1059   AST 20 02/16/2023 1059   ALT 21 02/16/2023 1059   BILITOT 0.5 02/16/2023 1059       RADIOGRAPHIC STUDIES:I have reviewed imaging with patient I have personally reviewed the radiological images as listed and agreed with the findings in the report. CT CHEST ABDOMEN PELVIS W CONTRAST  Result Date: 02/16/2023 CLINICAL DATA:  Testicular cancer restaging, status post orchiectomy and adjuvant therapy * Tracking Code: BO * EXAM: CT CHEST, ABDOMEN, AND PELVIS WITH CONTRAST TECHNIQUE: Multidetector CT imaging of the chest, abdomen and pelvis was performed following the standard protocol during bolus administration of intravenous contrast. RADIATION DOSE REDUCTION: This exam was performed according to the departmental dose-optimization program which includes automated exposure control, adjustment of the mA and/or kV according to patient size and/or use of iterative reconstruction technique. CONTRAST:  OMNIPAQUE IOHEXOL 300 MG/ML  SOLN COMPARISON:  08/18/2022 FINDINGS: CT CHEST FINDINGS Cardiovascular: Aortic atherosclerosis. Normal heart size. Left coronary artery calcifications. No pericardial effusion. Mediastinum/Nodes: No enlarged mediastinal, hilar, or axillary lymph nodes. Thyroid gland, trachea, and esophagus demonstrate no significant findings. Lungs/Pleura: Lungs are clear. No pleural effusion or pneumothorax. Musculoskeletal: No chest wall abnormality. No acute osseous findings. CT ABDOMEN PELVIS FINDINGS Hepatobiliary: No solid liver abnormality is seen. Contracted gallbladder. No gallstones, gallbladder wall thickening, or biliary dilatation. Pancreas: Unremarkable. No pancreatic ductal dilatation or surrounding inflammatory changes. Spleen: Normal in size without significant abnormality. Adrenals/Urinary Tract: Adrenal glands are unremarkable. Kidneys are normal, without renal calculi,  solid lesion, or hydronephrosis. Bladder is unremarkable. Stomach/Bowel: Stomach is within normal limits. Appendix appears normal. No evidence of bowel wall thickening, distention, or inflammatory changes. Descending and sigmoid diverticulosis. Circumferential wall thickening and fat stranding about the mid sigmoid, somewhat increased compared to prior examination (series 2, image 97). Moderate burden of stool throughout the colon. Vascular/Lymphatic: Aortic atherosclerosis. No enlarged abdominal or pelvic lymph nodes. Reproductive: Status post left orchiectomy. Other: No abdominal wall hernia or abnormality. No ascites. Musculoskeletal: No acute osseous findings. IMPRESSION: 1. Status post left orchiectomy. No evidence of lymphadenopathy or metastatic disease in the chest, abdomen, or pelvis. 2. Descending and sigmoid diverticulosis. Circumferential wall thickening and fat stranding about the mid sigmoid, somewhat increased compared to prior examination. Findings suggest mild acute diverticulitis superimposed upon chronic sequelae. Correlate for acutely referable symptoms. No evidence of perforation or abscess. 3. Coronary artery disease. Aortic Atherosclerosis (ICD10-I70.0). Electronically Signed   By: Jearld Lesch M.D.   On: 02/16/2023 16:28

## 2023-02-23 NOTE — Assessment & Plan Note (Signed)
He will continue metformin and life style changes

## 2023-06-18 DIAGNOSIS — E119 Type 2 diabetes mellitus without complications: Secondary | ICD-10-CM | POA: Diagnosis not present

## 2023-06-18 DIAGNOSIS — H40003 Preglaucoma, unspecified, bilateral: Secondary | ICD-10-CM | POA: Diagnosis not present

## 2023-06-18 DIAGNOSIS — H25812 Combined forms of age-related cataract, left eye: Secondary | ICD-10-CM | POA: Diagnosis not present

## 2023-06-18 DIAGNOSIS — H401231 Low-tension glaucoma, bilateral, mild stage: Secondary | ICD-10-CM | POA: Diagnosis not present

## 2023-08-20 ENCOUNTER — Encounter: Payer: Self-pay | Admitting: Hematology and Oncology

## 2023-08-23 ENCOUNTER — Telehealth: Payer: Self-pay | Admitting: Hematology and Oncology

## 2023-08-23 NOTE — Telephone Encounter (Signed)
 Left patient a vm regarding upcoming appointment

## 2023-08-28 ENCOUNTER — Other Ambulatory Visit: Payer: BC Managed Care – PPO

## 2023-08-28 ENCOUNTER — Ambulatory Visit: Payer: BC Managed Care – PPO | Admitting: Hematology and Oncology

## 2023-09-25 ENCOUNTER — Encounter: Payer: Self-pay | Admitting: Hematology and Oncology

## 2023-09-25 ENCOUNTER — Inpatient Hospital Stay (HOSPITAL_BASED_OUTPATIENT_CLINIC_OR_DEPARTMENT_OTHER): Payer: BC Managed Care – PPO | Admitting: Hematology and Oncology

## 2023-09-25 ENCOUNTER — Inpatient Hospital Stay: Payer: BC Managed Care – PPO | Attending: Hematology and Oncology

## 2023-09-25 VITALS — BP 157/84 | HR 77 | Temp 98.6°F | Resp 18 | Ht 69.0 in | Wt 234.6 lb

## 2023-09-25 DIAGNOSIS — I2510 Atherosclerotic heart disease of native coronary artery without angina pectoris: Secondary | ICD-10-CM | POA: Diagnosis not present

## 2023-09-25 DIAGNOSIS — C6292 Malignant neoplasm of left testis, unspecified whether descended or undescended: Secondary | ICD-10-CM

## 2023-09-25 DIAGNOSIS — K5730 Diverticulosis of large intestine without perforation or abscess without bleeding: Secondary | ICD-10-CM | POA: Diagnosis not present

## 2023-09-25 DIAGNOSIS — Z79899 Other long term (current) drug therapy: Secondary | ICD-10-CM | POA: Insufficient documentation

## 2023-09-25 DIAGNOSIS — C6202 Malignant neoplasm of undescended left testis: Secondary | ICD-10-CM | POA: Insufficient documentation

## 2023-09-25 DIAGNOSIS — Z9079 Acquired absence of other genital organ(s): Secondary | ICD-10-CM | POA: Diagnosis not present

## 2023-09-25 DIAGNOSIS — I700 Atherosclerosis of aorta: Secondary | ICD-10-CM | POA: Insufficient documentation

## 2023-09-25 LAB — CBC WITH DIFFERENTIAL (CANCER CENTER ONLY)
Abs Immature Granulocytes: 0.04 10*3/uL (ref 0.00–0.07)
Basophils Absolute: 0.1 10*3/uL (ref 0.0–0.1)
Basophils Relative: 1 %
Eosinophils Absolute: 0.2 10*3/uL (ref 0.0–0.5)
Eosinophils Relative: 3 %
HCT: 40.9 % (ref 39.0–52.0)
Hemoglobin: 14.3 g/dL (ref 13.0–17.0)
Immature Granulocytes: 1 %
Lymphocytes Relative: 23 %
Lymphs Abs: 1.8 10*3/uL (ref 0.7–4.0)
MCH: 33.6 pg (ref 26.0–34.0)
MCHC: 35 g/dL (ref 30.0–36.0)
MCV: 96.2 fL (ref 80.0–100.0)
Monocytes Absolute: 0.9 10*3/uL (ref 0.1–1.0)
Monocytes Relative: 12 %
Neutro Abs: 4.8 10*3/uL (ref 1.7–7.7)
Neutrophils Relative %: 60 %
Platelet Count: 269 10*3/uL (ref 150–400)
RBC: 4.25 MIL/uL (ref 4.22–5.81)
RDW: 12.6 % (ref 11.5–15.5)
WBC Count: 7.8 10*3/uL (ref 4.0–10.5)
nRBC: 0 % (ref 0.0–0.2)

## 2023-09-25 LAB — CMP (CANCER CENTER ONLY)
ALT: 29 U/L (ref 0–44)
AST: 30 U/L (ref 15–41)
Albumin: 4.5 g/dL (ref 3.5–5.0)
Alkaline Phosphatase: 96 U/L (ref 38–126)
Anion gap: 10 (ref 5–15)
BUN: 8 mg/dL (ref 6–20)
CO2: 27 mmol/L (ref 22–32)
Calcium: 9.8 mg/dL (ref 8.9–10.3)
Chloride: 97 mmol/L — ABNORMAL LOW (ref 98–111)
Creatinine: 0.88 mg/dL (ref 0.61–1.24)
GFR, Estimated: >60 mL/min (ref >60)
Glucose, Bld: 87 mg/dL (ref 70–99)
Potassium: 3.8 mmol/L (ref 3.5–5.1)
Sodium: 134 mmol/L — ABNORMAL LOW (ref 135–145)
Total Bilirubin: 0.6 mg/dL (ref 0.0–1.2)
Total Protein: 7.5 g/dL (ref 6.5–8.1)

## 2023-09-25 LAB — LACTATE DEHYDROGENASE: LDH: 161 U/L (ref 98–192)

## 2023-09-25 NOTE — Progress Notes (Signed)
 [facility] OFFICE PROGRESS NOTE  Patient Care Team: Hayden Pedro, MD as PCP - General (Family Medicine)  ASSESSMENT & PLAN:  Malignant neoplasm of left testis [facility]) Labs are pending, we will call him with test results He has no signs or symptoms of recurrence I plan to repeat blood work and imaging study in 6 months In July of this year, the patient would have reached at 3 years mark Beyond that, if CT imaging is negative, we will only order imaging study as clinically indicated  Orders Placed This Encounter  Procedures   CT CHEST ABDOMEN PELVIS W CONTRAST    Standing Status:   Future    Expected Date:   02/15/2024    Expiration Date:   09/24/2024    Scheduling Instructions:     No need oral contrast    If indicated for the ordered procedure, I authorize the administration of contrast media per Radiology protocol:   Yes    Does the patient have a contrast media/X-ray dye allergy?:   No    Preferred imaging location?:   [facility]    If indicated for the ordered procedure, I authorize the administration of oral contrast media per Radiology protocol:   Yes    All questions were answered. The patient knows to call the clinic with any problems, questions or concerns. The total time spent in the appointment was 25 minutes encounter with patients including review of chart and various tests results, discussions about plan of care and coordination of care plan   Artis Delay, MD 09/25/2023 12:35 PM  INTERVAL HISTORY: Please see below for problem oriented charting. he returns for surveillance follow-up He is doing well Denies abdominal pain or changes in bowel habits He is recently married He had recent hip surgery and has gained a lot of weight but appears motivated to go back to the gym as soon as possible We discussed future follow-up  REVIEW OF SYSTEMS:   Constitutional: Denies fevers, chills or abnormal weight loss Eyes: Denies blurriness of  vision Ears, nose, mouth, throat, and face: Denies mucositis or sore throat Respiratory: Denies cough, dyspnea or wheezes Cardiovascular: Denies palpitation, chest discomfort or lower extremity swelling Gastrointestinal:  Denies nausea, heartburn or change in bowel habits Skin: Denies abnormal skin rashes Lymphatics: Denies new lymphadenopathy or easy bruising Neurological:Denies numbness, tingling or new weaknesses Behavioral/Psych: Mood is stable, no new changes  All other systems were reviewed with the patient and are negative.  I have reviewed the past medical history, past surgical history, social history and family history with the patient and they are unchanged from previous note.  ALLERGIES:  has no known allergies.  MEDICATIONS:  Current Outpatient Medications  Medication Sig Dispense Refill   amLODipine (NORVASC) 5 MG tablet TAKE 1 TABLET(5 MG) BY MOUTH DAILY 30 tablet 0   aspirin 81 MG tablet Take 81 mg by mouth daily.     irbesartan (AVAPRO) 150 MG tablet Take 2 tablets (300 mg total) by mouth at bedtime. 180 tablet 0   metFORMIN (GLUCOPHAGE) 500 MG tablet TAKE 1 TABLET(500 MG) BY MOUTH TWICE DAILY WITH A MEAL 180 tablet 0   Multiple Vitamins-Minerals (MULTIVITAMIN PO) Take by mouth daily at 12 noon.     Omega-3 Fatty Acids (FISH OIL) 1200 MG CPDR Take by mouth daily.     No current facility-administered medications for this visit.    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 and all margins of resection are negative for tumor    TESTIS: Radical Orchiectomy  Specimen Laterality: Left Tumor Focality: Focal Tumor Size: 6.2 cm Histologic Type: Mixed germ cell tumor Tumor Extension: organ confined Margins: Negative Spermatic Cord Margin: Negative Other Margin(s) (required only if applicable): Negative Lymphovascular Invasion: Identified Regional Lymph Nodes:          No lymph nodes submitted or found: X          Number of Lymph Nodes Involved: NA          Nodal sites with Tumor: NA  Distant Metastasis: NA Pre-Orchiectomy Serum Tumor Markers: AFP 11.8 (range 0.0-6.1 ng/ml), LDH 383 (98-192U/L), HCG normal (0-3 mlU/ml) Pathologic Stage Classification (pTNM, AJCC 8th Edition): pT2, pN Representative Tumor Block: A3 Comment(s): Tumor compose of embryonal carcinoma, post-pubertal type yolk sac tumor and teratoma components, extensively involves and replaces testicular parenchyma, but limited to the testis. Lymphovascular invasion is identified.    03/24/2021 Initial Diagnosis   Testis cancer (HCC)   03/24/2021 Cancer Staging   Staging form: Testis, AJCC 7th Edition - Clinical: Stage IB (pT2, N0, M0, S0) - Signed by Benjiman Core, MD on 03/24/2021 Biopsy of metastatic site performed: No Lymph-vascular invasion (LVI): LVI present/identified, NOS Residual tumor (R): R0 - None Radical orchiectomy performed: Yes   Malignant neoplasm of left testis (HCC)  02/18/2021 Pathology Results   SURGICAL PATHOLOGY  CASE: [id number]  PATIENT: Leonard Garrett  Surgical Pathology Report   Clinical History: Left testicular mass (crm)   FINAL MICROSCOPIC DIAGNOSIS:   A. 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 and all margins of resection are negative for tumor    TESTIS: Radical Orchiectomy  Specimen Laterality: Left Tumor Focality: Focal Tumor Size: 6.2 cm Histologic Type: Mixed germ cell tumor Tumor Extension: organ confined Margins: Negative Spermatic Cord Margin: Negative Other Margin(s) (required only if applicable): Negative Lymphovascular Invasion: Identified Regional Lymph Nodes:          No lymph nodes submitted or found: X          Number of Lymph Nodes Involved: NA          Nodal sites with Tumor: NA  Distant Metastasis: NA Pre-Orchiectomy Serum Tumor Markers: AFP 11.8 (range 0.0-6.1 ng/ml), LDH 383 (98-192U/L), HCG normal (0-3 mlU/ml) Pathologic Stage Classification (pTNM, AJCC 8th Edition): pT2, pN Representative Tumor Block: A3 Comment(s): Tumor compose of embryonal carcinoma, post-pubertal type yolk sac tumor and teratoma components, extensively involves and replaces testicular parenchyma, but limited to the testis. Lymphovascular invasion is identified.    03/28/2021 Initial Diagnosis   Malignant neoplasm  of left testis (HCC)   04/25/2021 - 05/10/2021 Chemotherapy   Patient is on Treatment Plan :  TESTICULAR BEP q21d X 1 Cycle      08/25/2022 Cancer Staging   Staging form: Testis, AJCC 7th Edition - Clinical stage from 08/25/2022: pT2, N0, M0 - Signed by Artis Delay, MD on 08/25/2022 Staged by: Managing physician Stage prefix: Initial diagnosis Laterality: Left Biopsy of metastatic site performed: No   02/19/2023 Imaging   CT CHEST ABDOMEN PELVIS W CONTRAST  Result Date: 02/16/2023 CLINICAL DATA:  Testicular cancer restaging, status post orchiectomy and adjuvant therapy * Tracking Code: BO * EXAM: CT CHEST, ABDOMEN, AND PELVIS WITH CONTRAST  TECHNIQUE: Multidetector CT imaging of the chest, abdomen and pelvis was performed following the standard protocol during bolus administration of intravenous contrast. RADIATION DOSE REDUCTION: This exam was performed according to the departmental dose-optimization program which includes automated exposure control, adjustment of the mA and/or kV according to patient size and/or use of iterative reconstruction technique. CONTRAST:  OMNIPAQUE IOHEXOL 300 MG/ML  SOLN COMPARISON:  08/18/2022 FINDINGS: CT CHEST FINDINGS Cardiovascular: Aortic atherosclerosis. Normal heart size. Left coronary artery calcifications. No pericardial effusion. Mediastinum/Nodes: No enlarged mediastinal, hilar, or axillary lymph nodes. Thyroid gland, trachea, and esophagus demonstrate no significant findings. Lungs/Pleura: Lungs are clear. No pleural effusion or pneumothorax. Musculoskeletal: No chest wall abnormality. No acute osseous findings. CT ABDOMEN PELVIS FINDINGS Hepatobiliary: No solid liver abnormality is seen. Contracted gallbladder. No gallstones, gallbladder wall thickening, or biliary dilatation. Pancreas: Unremarkable. No pancreatic ductal dilatation or surrounding inflammatory changes. Spleen: Normal in size without significant abnormality. Adrenals/Urinary Tract: Adrenal glands are unremarkable. Kidneys are normal, without renal calculi, solid lesion, or hydronephrosis. Bladder is unremarkable. Stomach/Bowel: Stomach is within normal limits. Appendix appears normal. No evidence of bowel wall thickening, distention, or inflammatory changes. Descending and sigmoid diverticulosis. Circumferential wall thickening and fat stranding about the mid sigmoid, somewhat increased compared to prior examination (series 2, image 97). Moderate burden of stool throughout the colon. Vascular/Lymphatic: Aortic atherosclerosis. No enlarged abdominal or pelvic lymph nodes. Reproductive: Status post left orchiectomy. Other: No abdominal wall  hernia or abnormality. No ascites. Musculoskeletal: No acute osseous findings. IMPRESSION: 1. Status post left orchiectomy. No evidence of lymphadenopathy or metastatic disease in the chest, abdomen, or pelvis. 2. Descending and sigmoid diverticulosis. Circumferential wall thickening and fat stranding about the mid sigmoid, somewhat increased compared to prior examination. Findings suggest mild acute diverticulitis superimposed upon chronic sequelae. Correlate for acutely referable symptoms. No evidence of perforation or abscess. 3. Coronary artery disease. Aortic Atherosclerosis (ICD10-I70.0). Electronically Signed   By: Jearld Lesch M.D.   On: 02/16/2023 16:28        PHYSICAL EXAMINATION: ECOG PERFORMANCE STATUS: 0 - Asymptomatic  Vitals:   09/25/23 1122  BP: (!) 157/84  Pulse: 77  Resp: 18  Temp: 98.6 F (37 C)  SpO2: 99%   Filed Weights   09/25/23 1122  Weight: 234 lb 9.6 oz (106.4 kg)    GENERAL:alert, no distress and comfortable LABORATORY DATA:  I have reviewed the data as listed    Component Value Date/Time   NA 134 (L) 09/25/2023 1057   K 3.8 09/25/2023 1057   CL 97 (L) 09/25/2023 1057   CO2 27 09/25/2023 1057   GLUCOSE 87 09/25/2023 1057   BUN 8 09/25/2023 1057   CREATININE 0.88 09/25/2023 1057   CREATININE 1.07 09/02/2021 0811   CALCIUM 9.8 09/25/2023 1057   PROT 7.5 09/25/2023 1057   ALBUMIN 4.5  09/25/2023 1057   AST 30 09/25/2023 1057   ALT 29 09/25/2023 1057   ALKPHOS 96 09/25/2023 1057   BILITOT 0.6 09/25/2023 1057   GFRNONAA >60 09/25/2023 1057   GFRNONAA 80 08/10/2017 1007   GFRAA 92 08/10/2017 1007    No results found for: "SPEP", "UPEP"  Lab Results  Component Value Date   WBC 7.8 09/25/2023   NEUTROABS 4.8 09/25/2023   HGB 14.3 09/25/2023   HCT 40.9 09/25/2023   MCV 96.2 09/25/2023   PLT 269 09/25/2023      Chemistry      Component Value Date/Time   NA 134 (L) 09/25/2023 1057   K 3.8 09/25/2023 1057   CL 97 (L) 09/25/2023 1057    CO2 27 09/25/2023 1057   BUN 8 09/25/2023 1057   CREATININE 0.88 09/25/2023 1057   CREATININE 1.07 09/02/2021 0811      Component Value Date/Time   CALCIUM 9.8 09/25/2023 1057   ALKPHOS 96 09/25/2023 1057   AST 30 09/25/2023 1057   ALT 29 09/25/2023 1057   BILITOT 0.6 09/25/2023 1057

## 2023-09-25 NOTE — Assessment & Plan Note (Addendum)
 Labs are pending, we will call him with test results He has no signs or symptoms of recurrence I plan to repeat blood work and imaging study in 6 months In July of this year, the patient would have reached at 3 years mark Beyond that, if CT imaging is negative, we will only order imaging study as clinically indicated

## 2023-09-26 LAB — AFP TUMOR MARKER: AFP, Serum, Tumor Marker: <1.8 ng/mL (ref 0.0–8.4)

## 2023-09-26 LAB — BETA HCG QUANT (REF LAB): hCG Quant: <1 m[IU]/mL (ref 0–3)

## 2023-09-27 ENCOUNTER — Telehealth: Payer: Self-pay

## 2023-09-27 NOTE — Telephone Encounter (Signed)
-----   Message from Artis Delay sent at 09/27/2023  9:37 AM EST ----- Pls call him and let him know all tumor markers are neg

## 2023-09-27 NOTE — Telephone Encounter (Signed)
Called and given below message. He verbalized understanding and appreciated the call. 

## 2023-10-18 DIAGNOSIS — H401231 Low-tension glaucoma, bilateral, mild stage: Secondary | ICD-10-CM | POA: Diagnosis not present

## 2023-10-18 DIAGNOSIS — H25812 Combined forms of age-related cataract, left eye: Secondary | ICD-10-CM | POA: Diagnosis not present

## 2023-10-18 DIAGNOSIS — H04123 Dry eye syndrome of bilateral lacrimal glands: Secondary | ICD-10-CM | POA: Diagnosis not present

## 2024-02-15 ENCOUNTER — Ambulatory Visit (HOSPITAL_COMMUNITY)
Admission: RE | Admit: 2024-02-15 | Discharge: 2024-02-15 | Disposition: A | Discharge status: Home / Self Care | Source: Ambulatory Visit | Attending: Hematology and Oncology | Admitting: Hematology and Oncology

## 2024-02-15 ENCOUNTER — Inpatient Hospital Stay: Payer: BC Managed Care – PPO | Attending: Hematology and Oncology

## 2024-02-15 DIAGNOSIS — K5730 Diverticulosis of large intestine without perforation or abscess without bleeding: Secondary | ICD-10-CM | POA: Diagnosis not present

## 2024-02-15 DIAGNOSIS — C6292 Malignant neoplasm of left testis, unspecified whether descended or undescended: Secondary | ICD-10-CM | POA: Insufficient documentation

## 2024-02-15 DIAGNOSIS — D174 Benign lipomatous neoplasm of intrathoracic organs: Secondary | ICD-10-CM | POA: Diagnosis not present

## 2024-02-15 DIAGNOSIS — K760 Fatty (change of) liver, not elsewhere classified: Secondary | ICD-10-CM | POA: Diagnosis not present

## 2024-02-15 DIAGNOSIS — I2510 Atherosclerotic heart disease of native coronary artery without angina pectoris: Secondary | ICD-10-CM | POA: Diagnosis not present

## 2024-02-15 DIAGNOSIS — I700 Atherosclerosis of aorta: Secondary | ICD-10-CM | POA: Insufficient documentation

## 2024-02-15 DIAGNOSIS — Z9079 Acquired absence of other genital organ(s): Secondary | ICD-10-CM | POA: Insufficient documentation

## 2024-02-15 DIAGNOSIS — Z79899 Other long term (current) drug therapy: Secondary | ICD-10-CM | POA: Insufficient documentation

## 2024-02-15 DIAGNOSIS — Z96641 Presence of right artificial hip joint: Secondary | ICD-10-CM | POA: Insufficient documentation

## 2024-02-15 LAB — CMP (CANCER CENTER ONLY)
ALT: 41 U/L (ref 0–44)
AST: 29 U/L (ref 15–41)
Albumin: 4.1 g/dL (ref 3.5–5.0)
Alkaline Phosphatase: 81 U/L (ref 38–126)
Anion gap: 7 (ref 5–15)
BUN: 16 mg/dL (ref 6–20)
CO2: 27 mmol/L (ref 22–32)
Calcium: 10.1 mg/dL (ref 8.9–10.3)
Chloride: 102 mmol/L (ref 98–111)
Creatinine: 1.05 mg/dL (ref 0.61–1.24)
GFR, Estimated: >60 mL/min (ref >60)
Glucose, Bld: 124 mg/dL — ABNORMAL HIGH (ref 70–99)
Potassium: 4.6 mmol/L (ref 3.5–5.1)
Sodium: 136 mmol/L (ref 135–145)
Total Bilirubin: 0.5 mg/dL (ref 0.0–1.2)
Total Protein: 7.4 g/dL (ref 6.5–8.1)

## 2024-02-15 MED ORDER — IOHEXOL 300 MG/ML  SOLN
100.0000 mL | Freq: Once | INTRAMUSCULAR | Status: AC | PRN
  Administered 2024-02-15: 100 mL via INTRAVENOUS

## 2024-02-19 ENCOUNTER — Encounter: Payer: Self-pay | Admitting: Hematology and Oncology

## 2024-02-22 ENCOUNTER — Inpatient Hospital Stay: Payer: BC Managed Care – PPO | Admitting: Hematology and Oncology

## 2024-02-22 ENCOUNTER — Encounter: Payer: Self-pay | Admitting: Hematology and Oncology

## 2024-02-22 DIAGNOSIS — K5730 Diverticulosis of large intestine without perforation or abscess without bleeding: Secondary | ICD-10-CM

## 2024-02-22 DIAGNOSIS — C6292 Malignant neoplasm of left testis, unspecified whether descended or undescended: Secondary | ICD-10-CM | POA: Diagnosis not present

## 2024-02-22 NOTE — Assessment & Plan Note (Signed)
 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

## 2024-02-22 NOTE — Progress Notes (Signed)
 HEMATOLOGY-ONCOLOGY ELECTRONIC VISIT PROGRESS NOTE  Patient Care Team: Vannie Sharl RAMAN, MD as PCP - General (Family Medicine)  I connected with the patient via telephone conference and verified that I am speaking with the correct person using two identifiers. The patient's location is at home and I am providing care from the [facility] I discussed the limitations, risks, security and privacy concerns of performing an evaluation and management service by e-visits and the availability of in person appointments.  I also discussed with the patient that there may be a patient responsible charge related to this service. The patient expressed understanding and agreed to proceed.   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% He received adjuvant chemotherapy with 1 cycle of BEP, completed by October 2024  He is on surveillance He is not symptomatic CT imaging from July 2025 showed no evidence of disease recurrence Moving forward, I recommend surveillance blood work once a year We discussed signs and symptoms to watch out for disease recurrence Based on current guidelines, he does not need routine surveillance imaging studies  Sigmoid diverticulosis CT imaging shows some thickening in the sigmoid colon His last colonoscopy in 2023 showed diverticulosis He is not symptomatic to suggest diverticulitis We discussed the role of surveillance colonoscopy versus other surveillance modality He will reach out to his primary care doctor for management  No orders of the defined types were placed in this encounter.   INTERVAL HISTORY: Please see below for problem oriented charting. The purpose of today's discussion is to review test results Since last time I saw him, he  is doing well He is still active with physical activity Denies abdominal pain or swelling I reviewed blood work and CT imaging with the patient We discussed future surveillance  SUMMARY OF ONCOLOGIC HISTORY: Oncology History Overview Note  Mixed germ cell tumor, 6.2 cm  with embryonal carcinoma (50%), yolk sac tumor (40%), and teratoma (10%) components tumor limited to testis with Lymphovascular and rete testis invasion (pT2)    Testis cancer (HCC)  02/18/2021 Pathology Results   SURGICAL PATHOLOGY  CASE: [id number]  PATIENT: Leonard Garrett  Surgical Pathology Report   Clinical History: Left testicular mass (crm)   FINAL MICROSCOPIC DIAGNOSIS:   A. 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 and all margins of resection are negative for tumor    TESTIS: Radical Orchiectomy  Specimen Laterality: Left Tumor Focality: Focal Tumor Size: 6.2 cm Histologic Type: Mixed germ cell tumor Tumor Extension: organ confined Margins: Negative Spermatic Cord Margin: Negative Other Margin(s) (required only if applicable): Negative Lymphovascular Invasion: Identified Regional Lymph Nodes:          No lymph nodes submitted or found: X          Number of Lymph Nodes Involved: NA          Nodal sites with Tumor: NA  Distant Metastasis: NA Pre-Orchiectomy Serum Tumor Markers: AFP 11.8 (range 0.0-6.1 ng/ml), LDH 383 (98-192U/L), HCG normal (0-3 mlU/ml) Pathologic Stage Classification (pTNM, AJCC 8th Edition): pT2, pN Representative Tumor Block: A3 Comment(s): Tumor compose  of embryonal carcinoma, post-pubertal type yolk sac tumor and teratoma components, extensively involves and replaces testicular parenchyma, but limited to the  testis. Lymphovascular invasion is identified.    03/24/2021 Initial Diagnosis   Testis cancer (HCC)   03/24/2021 Cancer Staging   Staging form: Testis, AJCC 7th Edition - Clinical: Stage IB (pT2, N0, M0, S0) - Signed by Amadeo Windell SAILOR, MD on 03/24/2021 Biopsy of metastatic site performed: No Lymph-vascular invasion (LVI): LVI present/identified, NOS Residual tumor (R): R0 - None Radical orchiectomy performed: Yes   Malignant neoplasm of left testis (HCC)  02/18/2021 Pathology Results   SURGICAL PATHOLOGY  CASE: [id number]  PATIENT: Leonard Garrett  Surgical Pathology Report   Clinical History: Left testicular mass (crm)   FINAL MICROSCOPIC DIAGNOSIS:   A. 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 and all margins of resection are negative for tumor    TESTIS: Radical Orchiectomy  Specimen Laterality: Left Tumor Focality: Focal Tumor Size: 6.2 cm Histologic Type: Mixed germ cell tumor Tumor Extension: organ confined Margins: Negative Spermatic Cord Margin: Negative Other Margin(s) (required only if applicable): Negative Lymphovascular Invasion: Identified Regional Lymph Nodes:          No lymph nodes submitted or found: X          Number of Lymph Nodes Involved: NA          Nodal sites with Tumor: NA  Distant Metastasis: NA Pre-Orchiectomy Serum Tumor Markers: AFP 11.8 (range 0.0-6.1 ng/ml), LDH 383 (98-192U/L), HCG normal (0-3 mlU/ml) Pathologic Stage Classification (pTNM, AJCC 8th Edition): pT2, pN Representative Tumor Block: A3 Comment(s): Tumor compose of embryonal carcinoma, post-pubertal type yolk sac tumor and teratoma components, extensively involves and replaces testicular parenchyma, but limited to the  testis. Lymphovascular invasion is identified.    03/28/2021 Initial Diagnosis   Malignant neoplasm of left testis (HCC)   04/25/2021 - 05/10/2021 Chemotherapy   Patient is on Treatment Plan :  TESTICULAR BEP q21d X 1 Cycle      08/25/2022 Cancer Staging   Staging form: Testis, AJCC 7th Edition - Clinical stage from 08/25/2022: pT2, N0, M0 - Signed by Lonn Hicks, MD on 08/25/2022 Staged by: Managing physician Stage prefix: Initial diagnosis Laterality: Left Biopsy of metastatic site performed: No   02/19/2023 Imaging   CT CHEST ABDOMEN PELVIS W CONTRAST  Result Date: 02/16/2023 CLINICAL DATA:  Testicular cancer restaging, status post orchiectomy and adjuvant therapy * Tracking Code: BO * EXAM: CT CHEST, ABDOMEN, AND PELVIS WITH CONTRAST TECHNIQUE: Multidetector CT imaging of the chest, abdomen and pelvis was performed following the standard protocol during bolus administration of intravenous contrast. RADIATION DOSE REDUCTION: This exam was performed according to the departmental dose-optimization program which includes automated exposure control, adjustment of the mA and/or kV according to patient size and/or use of iterative reconstruction technique. CONTRAST:  OMNIPAQUE  IOHEXOL  300 MG/ML  SOLN COMPARISON:  08/18/2022 FINDINGS: CT CHEST FINDINGS Cardiovascular: Aortic atherosclerosis. Normal heart size. Left coronary artery calcifications. No pericardial effusion. Mediastinum/Nodes: No enlarged mediastinal, hilar, or axillary lymph nodes. Thyroid gland, trachea, and esophagus demonstrate no significant findings. Lungs/Pleura: Lungs are clear. No pleural effusion or pneumothorax. Musculoskeletal: No chest wall abnormality. No acute osseous findings. CT ABDOMEN PELVIS FINDINGS Hepatobiliary: No solid liver abnormality is seen. Contracted gallbladder.  No gallstones, gallbladder wall thickening, or biliary dilatation. Pancreas: Unremarkable. No pancreatic ductal dilatation or surrounding  inflammatory changes. Spleen: Normal in size without significant abnormality. Adrenals/Urinary Tract: Adrenal glands are unremarkable. Kidneys are normal, without renal calculi, solid lesion, or hydronephrosis. Bladder is unremarkable. Stomach/Bowel: Stomach is within normal limits. Appendix appears normal. No evidence of bowel wall thickening, distention, or inflammatory changes. Descending and sigmoid diverticulosis. Circumferential wall thickening and fat stranding about the mid sigmoid, somewhat increased compared to prior examination (series 2, image 97). Moderate burden of stool throughout the colon. Vascular/Lymphatic: Aortic atherosclerosis. No enlarged abdominal or pelvic lymph nodes. Reproductive: Status post left orchiectomy. Other: No abdominal wall hernia or abnormality. No ascites. Musculoskeletal: No acute osseous findings. IMPRESSION: 1. Status post left orchiectomy. No evidence of lymphadenopathy or metastatic disease in the chest, abdomen, or pelvis. 2. Descending and sigmoid diverticulosis. Circumferential wall thickening and fat stranding about the mid sigmoid, somewhat increased compared to prior examination. Findings suggest mild acute diverticulitis superimposed upon chronic sequelae. Correlate for acutely referable symptoms. No evidence of perforation or abscess. 3. Coronary artery disease. Aortic Atherosclerosis (ICD10-I70.0). Electronically Signed   By: Marolyn JONETTA Jaksch M.D.   On: 02/16/2023 16:28      09/25/2023 Tumor Marker   Patient's tumor was tested for the following markers: AFP and HCG. Results of the tumor marker test revealed both are undetectable.   02/15/2024 Imaging   CT CHEST ABDOMEN PELVIS W CONTRAST Result Date: 02/16/2024 CLINICAL DATA:  History of testicular cancer, monitor. * Tracking Code: BO * EXAM: CT CHEST, ABDOMEN, AND PELVIS WITH CONTRAST TECHNIQUE: Multidetector CT imaging of the chest, abdomen and pelvis was performed following the standard protocol during  bolus administration of intravenous contrast. RADIATION DOSE REDUCTION: This exam was performed according to the departmental dose-optimization program which includes automated exposure control, adjustment of the mA and/or kV according to patient size and/or use of iterative reconstruction technique. CONTRAST:  OMNIPAQUE  IOHEXOL  300 MG/ML  SOLN COMPARISON:  Multiple priors including most recent CT February 16, 2023 FINDINGS: CT CHEST FINDINGS Cardiovascular: Aortic atherosclerosis. Normal size heart. No significant pericardial effusion/thickening. Lipomatous hypertrophy of the intra-atrial septum. Mediastinum/Nodes: No suspicious thyroid nodule. No pathologically enlarged mediastinal, hilar or axillary lymph nodes. The esophagus is grossly unremarkable. Lungs/Pleura: No suspicious pulmonary nodules or masses. Scattered atelectasis/scarring. Musculoskeletal: No aggressive lytic or blastic lesion of bone. Multilevel degenerative change of the spine. Degenerative change of the shoulders. CT ABDOMEN PELVIS FINDINGS Hepatobiliary: Diffuse hepatic steatosis. Gallbladder is unremarkable. No biliary ductal dilation. Pancreas: No pancreatic ductal dilation or evidence of acute inflammation. Spleen: No splenomegaly. Adrenals/Urinary Tract: No suspicious adrenal nodule/mass. No hydronephrosis. Kidneys demonstrate symmetric enhancement. Urinary bladder is unremarkable for degree of distension. Stomach/Bowel: Stomach is nondistended. No pathologic dilation of small or large bowel. Colonic diverticulosis. Similar short segment thickening of the sigmoid colon. Vascular/Lymphatic: Aortic atherosclerosis. No pathologically enlarged abdominal or pelvic lymph nodes. Reproductive: Prostate glands unremarkable.  Prior orchiectomy. Other: No significant abdominopelvic free fluid. Musculoskeletal: Right total hip arthroplasty. No aggressive lytic or blastic lesion of bone. Left hemi transitional lumbosacral anatomy. IMPRESSION: 1. No  evidence of metastatic disease within the chest, abdomen, or pelvis. 2. Similar short segment thickening of the sigmoid colon, consider correlation with colonoscopy if not recently performed. 3. Diffuse hepatic steatosis. 4. Colonic diverticulosis without evidence of acute diverticulitis. 5.  Aortic Atherosclerosis (ICD10-I70.0). Electronically Signed   By: Reyes Holder M.D.   On: 02/16/2024 12:25       I discussed the  assessment and treatment plan with the patient. The patient was provided an opportunity to ask questions and all were answered. The patient agreed with the plan and demonstrated an understanding of the instructions. The patient was advised to call back or seek an in-person evaluation if the symptoms worsen or if the condition fails to improve as anticipated.    I spent 30 minutes for the appointment reviewing test results, discuss management and coordination of care.  Almarie Bedford, MD 02/22/2024 12:36 PM

## 2024-02-22 NOTE — Assessment & Plan Note (Signed)
 CT imaging shows some thickening in the sigmoid colon His last colonoscopy in 2023 showed diverticulosis He is not symptomatic to suggest diverticulitis We discussed the role of surveillance colonoscopy versus other surveillance modality He will reach out to his primary care doctor for management
